# Patient Record
Sex: Male | Born: 1990 | Race: White | Hispanic: No | Marital: Single | Smoking: Current every day smoker
Health system: Southern US, Community
[De-identification: ages and names within clinical notes are randomized; demographics above are authoritative.]

## PROBLEM LIST (undated history)

## (undated) DIAGNOSIS — J45909 Unspecified asthma, uncomplicated: Secondary | ICD-10-CM

## (undated) DIAGNOSIS — F909 Attention-deficit hyperactivity disorder, unspecified type: Secondary | ICD-10-CM

## (undated) DIAGNOSIS — L0390 Cellulitis, unspecified: Secondary | ICD-10-CM

---
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
## 2013-05-04 IMAGING — CR DG HAND COMPLETE 3+V*L*
3 series · 3 of 3 positions shown · non-contrast
Comparison: None.

CLINICAL DATA: Pain, trauma.

LEFT HAND - COMPLETE 3+ VIEW

[view not recorded (1 of 3)]
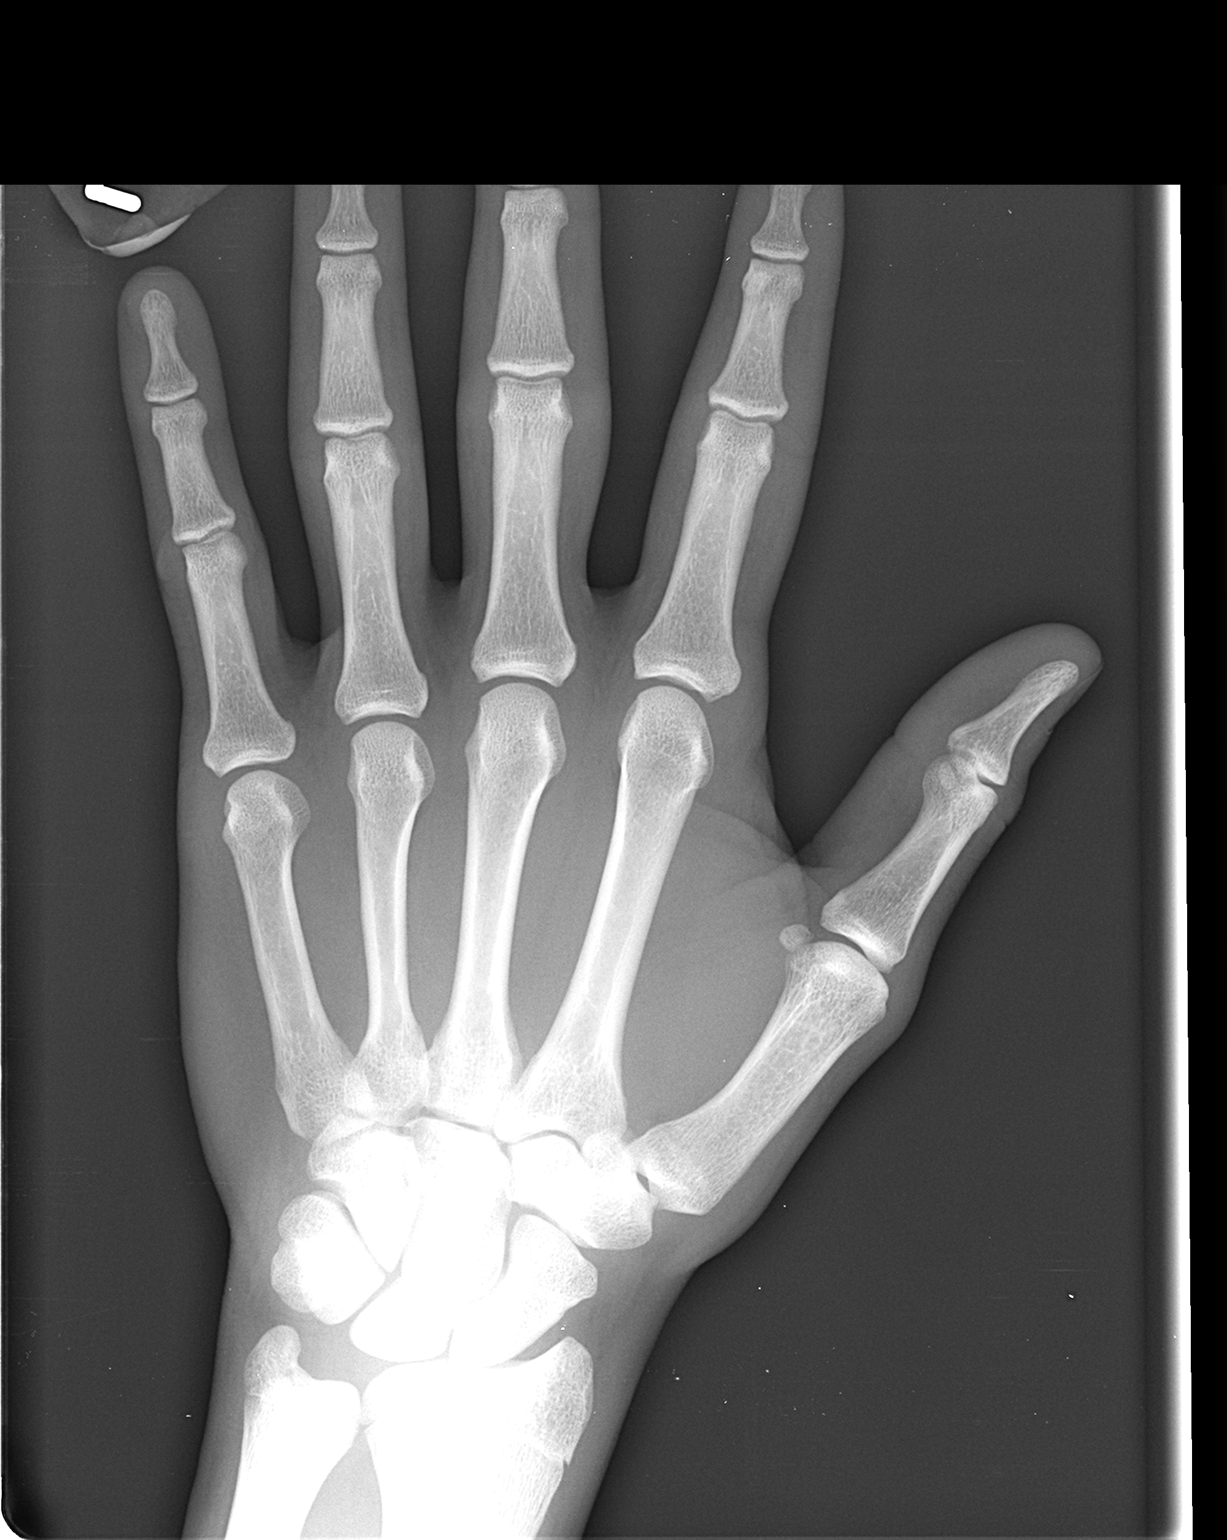

[view not recorded (2 of 3)]
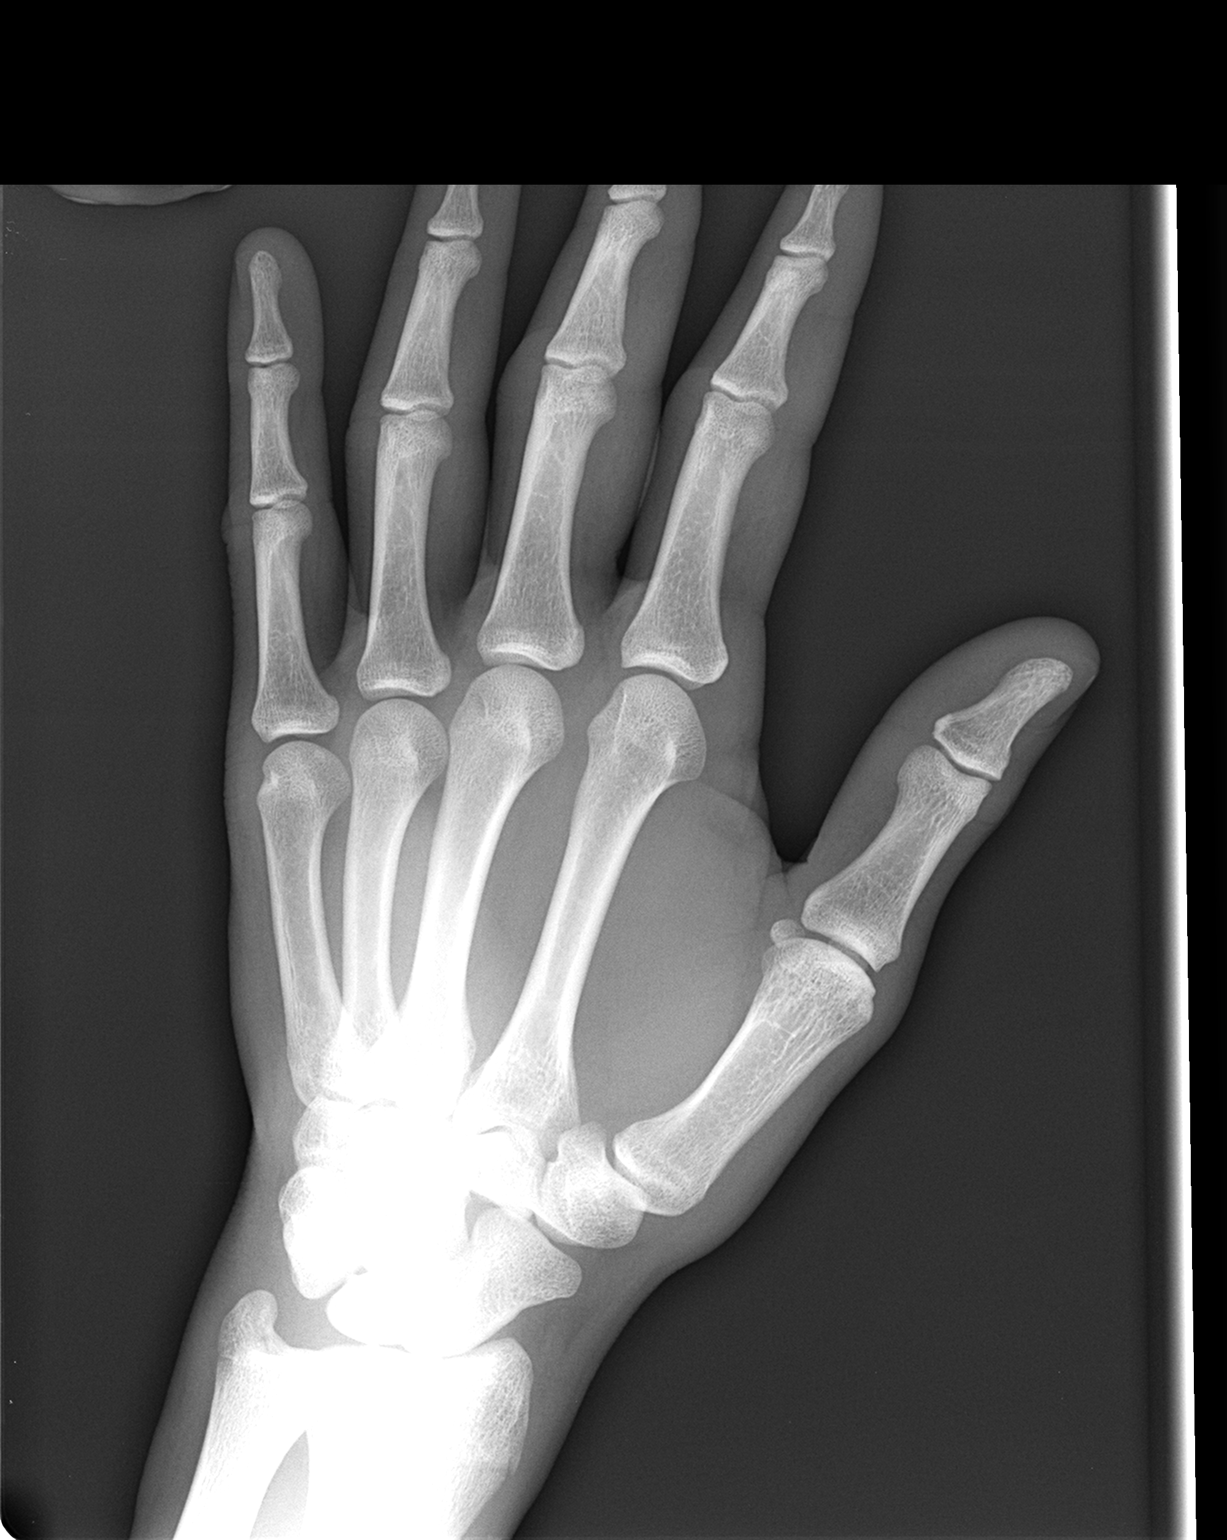

[view not recorded (3 of 3)]
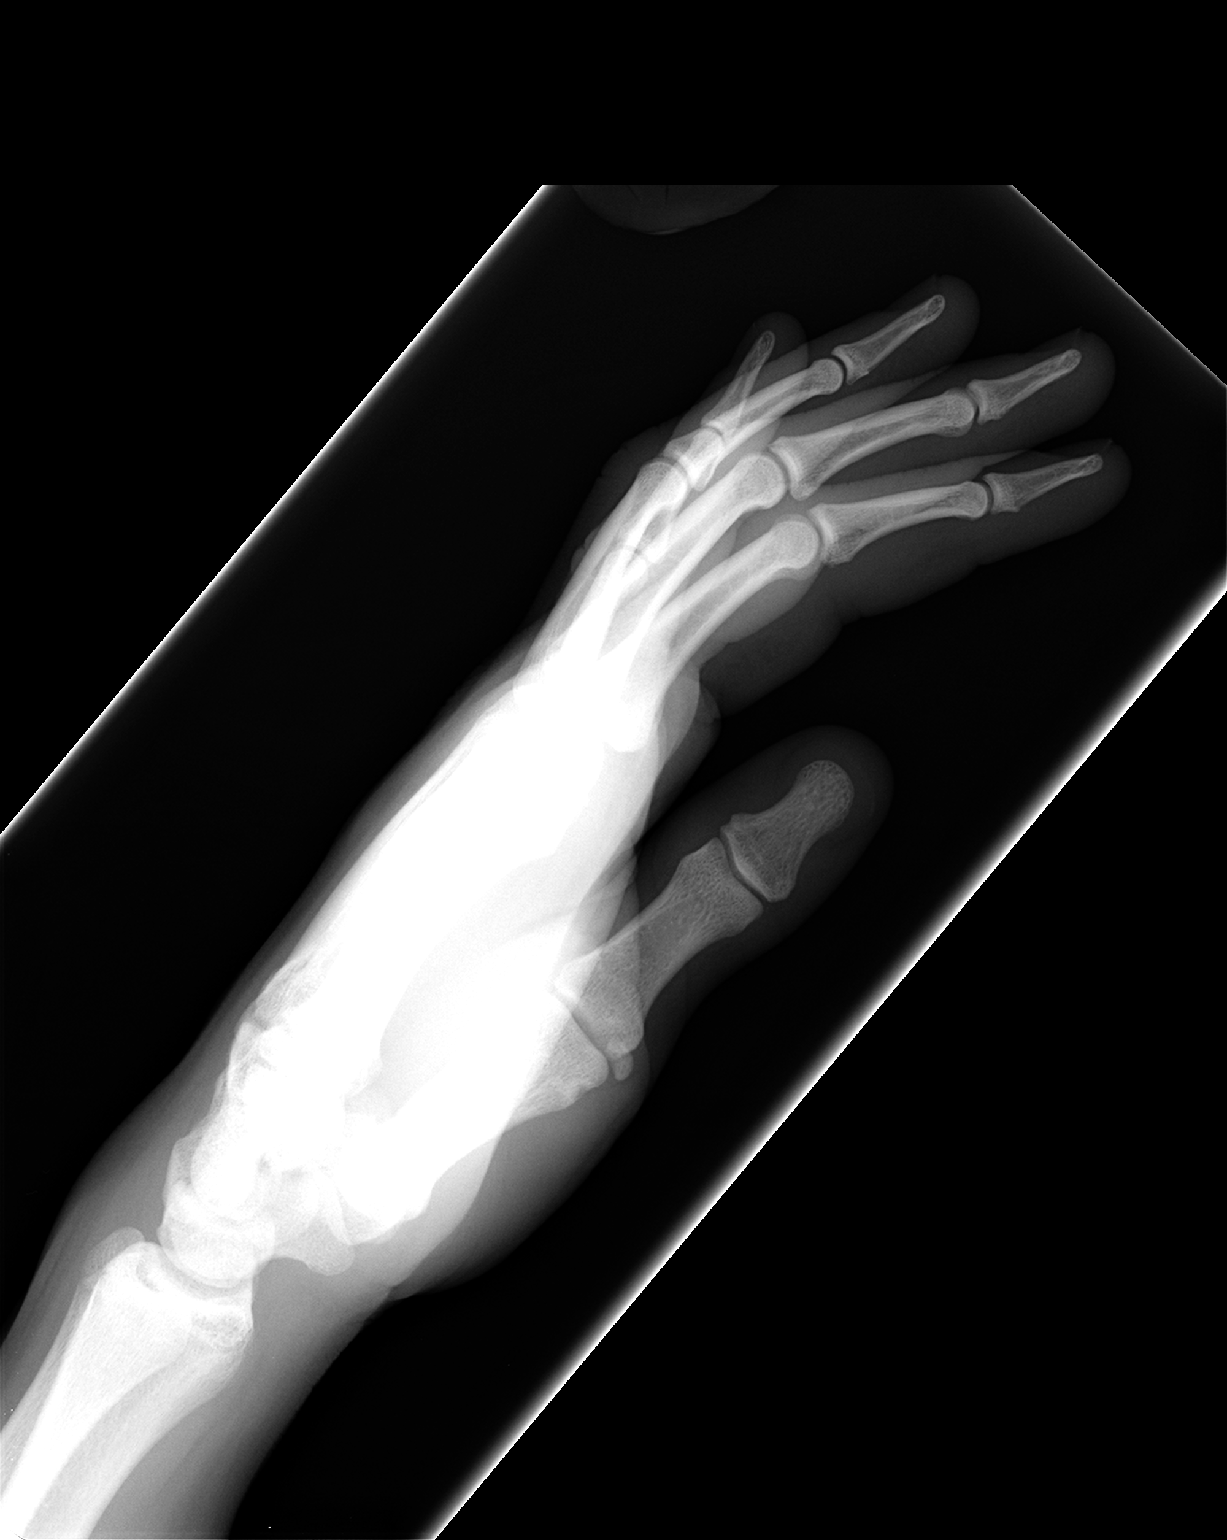

[3 of 3 positions shown; findings below may reference images not displayed]

FINDINGS: No fracture or dislocation.  No aggressive osseous
lesion.  No radiopaque foreign body.
IMPRESSION: No acute osseous abnormality of the left hand. If clinical concern
for a fracture persists, recommend a repeat radiograph in 5-10 days
to evaluate for interval change or callus formation.

## 2018-04-18 ENCOUNTER — Other Ambulatory Visit: Payer: Self-pay

## 2018-04-18 ENCOUNTER — Encounter (HOSPITAL_COMMUNITY): Payer: Self-pay | Admitting: Emergency Medicine

## 2018-04-18 ENCOUNTER — Emergency Department (HOSPITAL_COMMUNITY)
Admission: EM | Admit: 2018-04-18 | Discharge: 2018-04-18 | Disposition: A | Discharge status: Home / Self Care | Payer: Self-pay | Attending: Emergency Medicine | Admitting: Emergency Medicine

## 2018-04-18 DIAGNOSIS — J45909 Unspecified asthma, uncomplicated: Secondary | ICD-10-CM | POA: Insufficient documentation

## 2018-04-18 DIAGNOSIS — F909 Attention-deficit hyperactivity disorder, unspecified type: Secondary | ICD-10-CM | POA: Insufficient documentation

## 2018-04-18 DIAGNOSIS — L02415 Cutaneous abscess of right lower limb: Secondary | ICD-10-CM | POA: Insufficient documentation

## 2018-04-18 DIAGNOSIS — F17210 Nicotine dependence, cigarettes, uncomplicated: Secondary | ICD-10-CM | POA: Insufficient documentation

## 2018-04-18 DIAGNOSIS — L03115 Cellulitis of right lower limb: Secondary | ICD-10-CM | POA: Insufficient documentation

## 2018-04-18 MED ORDER — HYDROCODONE-ACETAMINOPHEN 5-325 MG PO TABS
1.0000 | ORAL_TABLET | Freq: Once | ORAL | Status: AC
  Administered 2018-04-18: 1 via ORAL
  Filled 2018-04-18: qty 1

## 2018-04-18 MED ORDER — LIDOCAINE-EPINEPHRINE (PF) 2 %-1:200000 IJ SOLN
10.0000 mL | Freq: Once | INTRAMUSCULAR | Status: DC
  Filled 2018-04-18: qty 10

## 2018-04-18 MED ORDER — SULFAMETHOXAZOLE-TRIMETHOPRIM 800-160 MG PO TABS
1.0000 | ORAL_TABLET | Freq: Once | ORAL | Status: AC
  Administered 2018-04-18: 16:00:00 1 via ORAL
  Filled 2018-04-18: qty 1

## 2018-04-18 MED ORDER — IBUPROFEN 800 MG PO TABS
800.0000 mg | ORAL_TABLET | Freq: Once | ORAL | Status: AC
  Administered 2018-04-18: 800 mg via ORAL
  Filled 2018-04-18: qty 1

## 2018-04-18 MED ORDER — IBUPROFEN 600 MG PO TABS: 600.0000 mg | ORAL_TABLET | Freq: Four times a day (QID) | ORAL | 0 refills | Status: DC | PRN

## 2018-04-18 MED ORDER — HYDROCODONE-ACETAMINOPHEN 5-325 MG PO TABS: 1.0000 | ORAL_TABLET | ORAL | 0 refills | Status: DC | PRN

## 2018-04-18 MED ORDER — SULFAMETHOXAZOLE-TRIMETHOPRIM 800-160 MG PO TABS: 1.0000 | ORAL_TABLET | Freq: Two times a day (BID) | ORAL | 0 refills | Status: AC

## 2018-04-18 NOTE — ED Triage Notes (Signed)
Patient complains of right knee pain and purulent drainage. Redness and swelling x 3 days. Denies fever, n/v.

## 2018-04-18 NOTE — ED Notes (Signed)
Pt verbalized understanding of no driving and to use caution within 4 hours of taking pain meds due to meds cause drowsiness.  Instructed pt to take all of antibiotics as prescribed. 

## 2018-04-18 NOTE — ED Provider Notes (Signed)
[facility] EMERGENCY DEPARTMENT Provider Note   CSN: [id number] Arrival date & time: 04/18/18  1519    History   Chief Complaint Chief Complaint  Patient presents with  . Knee Pain    HPI Blake Trujillo is a 28 y.o. male.     Pt presents to the ED today with redness and swelling to right knee.  Pt said it's been there for about 3 days and is getting worse.  He has noticed some drainage from a wound.     Past Medical History:  Diagnosis Date  . ADHD (attention deficit hyperactivity disorder)   . Asthma     There are no active problems to display for this patient.   History reviewed. No pertinent surgical history.      Home Medications    Prior to Admission medications   Medication Sig Start Date End Date Taking? Authorizing Provider  HYDROcodone-acetaminophen (NORCO/VICODIN) 5-325 MG tablet Take 1 tablet by mouth every 4 (four) hours as needed. 04/18/18   Jacalyn Lefevre, MD  ibuprofen (ADVIL,MOTRIN) 600 MG tablet Take 1 tablet (600 mg total) by mouth every 6 (six) hours as needed. 04/18/18   Jacalyn Lefevre, MD  sulfamethoxazole-trimethoprim (BACTRIM DS,SEPTRA DS) 800-160 MG tablet Take 1 tablet by mouth 2 (two) times daily for 7 days. 04/18/18 04/25/18  Jacalyn Lefevre, MD    Family History No family history on file.  Social History Social History   Tobacco Use  . Smoking status: Current Every Day Smoker  . Smokeless tobacco: Never Used  Substance Use Topics  . Alcohol use: Yes    Comment: occ  . Drug use: No     Allergies   Patient has no known allergies.   Review of Systems Review of Systems  Skin: Positive for wound.  All other systems reviewed and are negative.    Physical Exam Updated Vital Signs BP (!) 144/81 (BP Location: Left Arm)   Pulse 100   Temp 98.5 F (36.9 C) (Oral)   Resp 16   Ht 6\' 4"  (1.93 m)   Wt 83.9 kg   SpO2 100%   BMI 22.52 kg/m   Physical Exam Vitals signs and nursing note reviewed.  Constitutional:    Appearance: Normal appearance.  HENT:     Head: Normocephalic and atraumatic.     Right Ear: External ear normal.     Left Ear: External ear normal.     Nose: Nose normal.     Mouth/Throat:     Mouth: Mucous membranes are moist.  Eyes:     Extraocular Movements: Extraocular movements intact.     Pupils: Pupils are equal, round, and reactive to light.  Neck:     Musculoskeletal: Normal range of motion and neck supple.  Cardiovascular:     Rate and Rhythm: Normal rate and regular rhythm.     Pulses: Normal pulses.     Heart sounds: Normal heart sounds.  Pulmonary:     Effort: Pulmonary effort is normal.  Abdominal:     General: Abdomen is flat.  Musculoskeletal: Normal range of motion.  Skin:    Capillary Refill: Capillary refill takes less than 2 seconds.     Comments: Right knee with abscess and cellulitis.  Good rom in knee.  Doubt septic knee. See pictures.  Neurological:     General: No focal deficit present.     Mental Status: He is alert and oriented to person, place, and time.  Psychiatric:  Mood and Affect: Mood normal.        Behavior: Behavior normal.          ED Treatments / Results  Labs (all labs ordered are listed, but only abnormal results are displayed) Labs Reviewed - No data to display  EKG None  Radiology No results found.  Procedures .Marland KitchenIncision and Drainage Date/Time: 04/18/2018 3:54 PM Performed by: Jacalyn Lefevre, MD Authorized by: Jacalyn Lefevre, MD   Consent:    Consent obtained:  Verbal   Consent given by:  Patient   Risks discussed:  Bleeding, incomplete drainage and pain   Alternatives discussed:  No treatment Location:    Type:  Abscess   Size:  1   Location:  Lower extremity   Lower extremity location:  Knee   Knee location:  R knee Pre-procedure details:    Skin preparation:  Betadine Anesthesia (see MAR for exact dosages):    Anesthesia method:  Local infiltration   Local anesthetic:  Lidocaine 2% WITH epi  Procedure type:    Complexity:  Simple Procedure details:    Incision types:  Cruciate   Incision depth:  Dermal   Scalpel blade:  11   Wound management:  Probed and deloculated   Drainage:  Bloody and purulent   Drainage amount:  Moderate   Wound treatment:  Wound left open   Packing materials:  None Post-procedure details:    Patient tolerance of procedure:  Tolerated well, no immediate complications   (including critical care time)  Medications Ordered in ED Medications  lidocaine-EPINEPHrine (XYLOCAINE W/EPI) 2 %-1:200000 (PF) injection 10 mL (has no administration in time range)  sulfamethoxazole-trimethoprim (BACTRIM DS,SEPTRA DS) 800-160 MG per tablet 1 tablet (1 tablet Oral Given 04/18/18 1537)  ibuprofen (ADVIL,MOTRIN) tablet 800 mg (800 mg Oral Given 04/18/18 1537)  HYDROcodone-acetaminophen (NORCO/VICODIN) 5-325 MG per tablet 1 tablet (1 tablet Oral Given 04/18/18 1537)     Initial Impression / Assessment and Plan / ED Course  I have reviewed the triage vital signs and the nursing notes.  Pertinent labs & imaging results that were available during my care of the patient were reviewed by me and considered in my medical decision making (see chart for details).    Pt d/c home with bactrim.  He knows to return if worse.  F/u with pcp.  Final Clinical Impressions(s) / ED Diagnoses   Final diagnoses:  Cutaneous abscess of right lower extremity    ED Discharge Orders         Ordered    sulfamethoxazole-trimethoprim (BACTRIM DS,SEPTRA DS) 800-160 MG tablet  2 times daily     04/18/18 1553    ibuprofen (ADVIL,MOTRIN) 600 MG tablet  Every 6 hours PRN     04/18/18 1553    HYDROcodone-acetaminophen (NORCO/VICODIN) 5-325 MG tablet  Every 4 hours PRN     04/18/18 1553           Jacalyn Lefevre, MD 04/18/18 1555

## 2018-04-19 ENCOUNTER — Emergency Department (HOSPITAL_COMMUNITY)
Admission: EM | Admit: 2018-04-19 | Discharge: 2018-04-19 | Disposition: A | Discharge status: Home / Self Care | Payer: Self-pay | Attending: Emergency Medicine | Admitting: Emergency Medicine

## 2018-04-19 ENCOUNTER — Other Ambulatory Visit: Payer: Self-pay

## 2018-04-19 ENCOUNTER — Encounter (HOSPITAL_COMMUNITY): Payer: Self-pay | Admitting: *Deleted

## 2018-04-19 DIAGNOSIS — J45909 Unspecified asthma, uncomplicated: Secondary | ICD-10-CM | POA: Insufficient documentation

## 2018-04-19 DIAGNOSIS — F17200 Nicotine dependence, unspecified, uncomplicated: Secondary | ICD-10-CM | POA: Insufficient documentation

## 2018-04-19 DIAGNOSIS — L03115 Cellulitis of right lower limb: Secondary | ICD-10-CM | POA: Insufficient documentation

## 2018-04-19 DIAGNOSIS — Z79899 Other long term (current) drug therapy: Secondary | ICD-10-CM | POA: Insufficient documentation

## 2018-04-19 MED ORDER — DOXYCYCLINE HYCLATE 100 MG PO TABS
100.0000 mg | ORAL_TABLET | Freq: Once | ORAL | Status: AC
  Administered 2018-04-19: 04:00:00 100 mg via ORAL
  Filled 2018-04-19: qty 1

## 2018-04-19 MED ORDER — DOXYCYCLINE HYCLATE 100 MG PO CAPS: 100.0000 mg | ORAL_CAPSULE | Freq: Two times a day (BID) | ORAL | 0 refills | Status: DC

## 2018-04-19 NOTE — ED Provider Notes (Signed)
[facility] EMERGENCY DEPARTMENT Provider Note   CSN: [id number] Arrival date & time: 04/19/18  0400    History   Chief Complaint Chief Complaint  Patient presents with  . Knee Pain    HPI Blake Trujillo is a 28 y.o. male.     Patient presents to the emergency department for evaluation recheck of cellulitis.  Patient was seen in the ER yesterday for an abscess of the right lower leg with associated cellulitis.  He reports that the pain has significantly improved.  He has normal range of motion of the knee now, no longer hurts when he moves but he has noticed tonight that the area is more swollen and more red.  He has not had any fever.  He reports that the drainage from the incision and drainage site has stopped.     Past Medical History:  Diagnosis Date  . ADHD (attention deficit hyperactivity disorder)   . Asthma     There are no active problems to display for this patient.   History reviewed. No pertinent surgical history.      Home Medications    Prior to Admission medications   Medication Sig Start Date End Date Taking? Authorizing Provider  doxycycline (VIBRAMYCIN) 100 MG capsule Take 1 capsule (100 mg total) by mouth 2 (two) times daily. 04/19/18   Gilda Crease, MD  HYDROcodone-acetaminophen (NORCO/VICODIN) 5-325 MG tablet Take 1 tablet by mouth every 4 (four) hours as needed. 04/18/18   Jacalyn Lefevre, MD  ibuprofen (ADVIL,MOTRIN) 600 MG tablet Take 1 tablet (600 mg total) by mouth every 6 (six) hours as needed. 04/18/18   Jacalyn Lefevre, MD  sulfamethoxazole-trimethoprim (BACTRIM DS,SEPTRA DS) 800-160 MG tablet Take 1 tablet by mouth 2 (two) times daily for 7 days. 04/18/18 04/25/18  Jacalyn Lefevre, MD    Family History No family history on file.  Social History Social History   Tobacco Use  . Smoking status: Current Every Day Smoker  . Smokeless tobacco: Never Used  Substance Use Topics  . Alcohol use: Yes    Comment: occ  . Drug use: No      Allergies   Patient has no known allergies.   Review of Systems Review of Systems  Constitutional: Negative for fever.  Skin: Positive for color change.  All other systems reviewed and are negative.    Physical Exam Updated Vital Signs BP 132/84   Pulse 88   Temp (!) 97.5 F (36.4 C) (Oral)   Resp 16   Ht 6\' 4"  (1.93 m)   Wt 83.9 kg   SpO2 97%   BMI 22.51 kg/m   Physical Exam Vitals signs and nursing note reviewed.  Constitutional:      General: He is not in acute distress.    Appearance: Normal appearance. He is well-developed.  HENT:     Head: Normocephalic and atraumatic.     Right Ear: Hearing normal.     Left Ear: Hearing normal.     Nose: Nose normal.  Eyes:     Conjunctiva/sclera: Conjunctivae normal.     Pupils: Pupils are equal, round, and reactive to light.  Neck:     Musculoskeletal: Normal range of motion and neck supple.  Cardiovascular:     Rate and Rhythm: Regular rhythm.     Heart sounds: S1 normal and S2 normal. No murmur. No friction rub. No gallop.   Pulmonary:     Effort: Pulmonary effort is normal. No respiratory distress.  Breath sounds: Normal breath sounds.  Chest:     Chest wall: No tenderness.  Abdominal:     General: Bowel sounds are normal.     Palpations: Abdomen is soft.     Tenderness: There is no abdominal tenderness. There is no guarding or rebound. Negative signs include Murphy's sign and McBurney's sign.     Hernia: No hernia is present.  Musculoskeletal: Normal range of motion.     Comments: Normal range of motion right knee, no appreciable joint effusion.  Skin:    General: Skin is warm and dry.     Findings: Erythema present. No rash.     Comments: Erythema and warmth of right leg, from above the knee to mid shin.  Incision and drainage site intact, no purulent drainage at this time.  Neurological:     Mental Status: He is alert and oriented to person, place, and time.     GCS: GCS eye subscore is 4. GCS verbal  subscore is 5. GCS motor subscore is 6.     Cranial Nerves: No cranial nerve deficit.     Sensory: No sensory deficit.     Coordination: Coordination normal.  Psychiatric:        Speech: Speech normal.        Behavior: Behavior normal.        Thought Content: Thought content normal.        ED Treatments / Results  Labs (all labs ordered are listed, but only abnormal results are displayed) Labs Reviewed - No data to display  EKG None  Radiology No results found.  Procedures Procedures (including critical care time)  Medications Ordered in ED Medications - No data to display   Initial Impression / Assessment and Plan / ED Course  I have reviewed the triage vital signs and the nursing notes.  Pertinent labs & imaging results that were available during my care of the patient were reviewed by me and considered in my medical decision making (see chart for details).        Patient has noticed increased redness and swelling of the leg after incision and drainage yesterday.  It sounds like he has been squeezing on the area trying to get more pus out.  This may have increased the amount of cellulitis.  Note from yesterday was reviewed and images were reviewed, there is progression of the erythema.  He does, however, report that the pain is significantly improved.  He has completely normal range of motion of the knee without pain.  There is no joint effusion.  This is not a septic joint.  I suspect that this has just progressed as would be expected because he has not been on antibiotics long enough.  I do not want to admit the patient because of the current COVID-19 pandemic.  I did, however, recommend IV vancomycin as a single dose and possible follow-up tomorrow for recheck.  Patient reports that he "does not like needles" does not want to be here long enough to do the IV.  I will therefore add doxycycline to the Bactrim, given return precautions.  Final Clinical Impressions(s) / ED  Diagnoses   Final diagnoses:  Cellulitis of right lower extremity    ED Discharge Orders         Ordered    doxycycline (VIBRAMYCIN) 100 MG capsule  2 times daily     04/19/18 0421           Gilda CreasePollina, Larrie Lucia J, MD 04/19/18 [phone]

## 2018-04-19 NOTE — ED Triage Notes (Signed)
Pt c/o right knee swelling that has gotten worse, was seen in er for same on 04/18/2018.

## 2018-06-16 ENCOUNTER — Other Ambulatory Visit: Payer: Self-pay

## 2018-06-16 ENCOUNTER — Emergency Department (HOSPITAL_COMMUNITY)
Admission: EM | Admit: 2018-06-16 | Discharge: 2018-06-16 | Disposition: A | Discharge status: Home / Self Care | Payer: Self-pay | Attending: Emergency Medicine | Admitting: Emergency Medicine

## 2018-06-16 ENCOUNTER — Encounter (HOSPITAL_COMMUNITY): Payer: Self-pay | Admitting: Emergency Medicine

## 2018-06-16 DIAGNOSIS — R2241 Localized swelling, mass and lump, right lower limb: Secondary | ICD-10-CM | POA: Insufficient documentation

## 2018-06-16 DIAGNOSIS — L03115 Cellulitis of right lower limb: Secondary | ICD-10-CM | POA: Insufficient documentation

## 2018-06-16 DIAGNOSIS — J45909 Unspecified asthma, uncomplicated: Secondary | ICD-10-CM | POA: Insufficient documentation

## 2018-06-16 DIAGNOSIS — L539 Erythematous condition, unspecified: Secondary | ICD-10-CM | POA: Insufficient documentation

## 2018-06-16 DIAGNOSIS — F17200 Nicotine dependence, unspecified, uncomplicated: Secondary | ICD-10-CM | POA: Insufficient documentation

## 2018-06-16 HISTORY — DX: Cellulitis, unspecified: L03.90

## 2018-06-16 MED ORDER — DOXYCYCLINE HYCLATE 100 MG PO CAPS: 100.0000 mg | ORAL_CAPSULE | Freq: Two times a day (BID) | ORAL | 0 refills | Status: AC

## 2018-06-16 MED ORDER — DOXYCYCLINE HYCLATE 100 MG PO TABS
100.0000 mg | ORAL_TABLET | Freq: Once | ORAL | Status: AC
  Administered 2018-06-16: 23:00:00 100 mg via ORAL
  Filled 2018-06-16: qty 1
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
## 2018-06-16 NOTE — Discharge Instructions (Signed)
Take doxycycline as prescribed.  Take entire course, even if your symptoms improve. Use Tylenol and ibuprofen as needed for pain. Use ice to help with pain and swelling. Keep your foot elevated when able to help with pain and swelling. Monitor for worsening signs of infection.  Return to the emergency room if you develop high fevers, streaking up your leg, worsening symptoms despite antibiotics, or any new, or concerning symptoms.

## 2018-06-16 NOTE — ED Triage Notes (Signed)
Pt's right foot is red, swollen and warm to touch x 2 days. Pt states he has history of cellulitis.

## 2018-06-17 ENCOUNTER — Encounter (HOSPITAL_COMMUNITY): Payer: Self-pay | Admitting: Emergency Medicine

## 2018-06-17 ENCOUNTER — Other Ambulatory Visit: Payer: Self-pay

## 2018-06-17 ENCOUNTER — Emergency Department (HOSPITAL_COMMUNITY)
Admission: EM | Admit: 2018-06-17 | Discharge: 2018-06-18 | Disposition: A | Discharge status: Home / Self Care | Payer: Self-pay | Attending: Emergency Medicine | Admitting: Emergency Medicine

## 2018-06-17 DIAGNOSIS — J45909 Unspecified asthma, uncomplicated: Secondary | ICD-10-CM | POA: Insufficient documentation

## 2018-06-17 DIAGNOSIS — L03119 Cellulitis of unspecified part of limb: Secondary | ICD-10-CM

## 2018-06-17 DIAGNOSIS — F17200 Nicotine dependence, unspecified, uncomplicated: Secondary | ICD-10-CM | POA: Insufficient documentation

## 2018-06-17 DIAGNOSIS — L03115 Cellulitis of right lower limb: Secondary | ICD-10-CM | POA: Insufficient documentation

## 2018-06-17 LAB — CBC
HCT: 40.8 % (ref 39.0–52.0)
Hemoglobin: 13.4 g/dL (ref 13.0–17.0)
MCH: 31 pg (ref 26.0–34.0)
MCHC: 32.8 g/dL (ref 30.0–36.0)
MCV: 94.4 fL (ref 80.0–100.0)
Platelets: 300 10*3/uL (ref 150–400)
RBC: 4.32 MIL/uL (ref 4.22–5.81)
RDW: 13.4 % (ref 11.5–15.5)
WBC: 10.2 10*3/uL (ref 4.0–10.5)
nRBC: 0 % (ref 0.0–0.2)

## 2018-06-17 MED ORDER — KETOROLAC TROMETHAMINE 30 MG/ML IJ SOLN
15.0000 mg | Freq: Once | INTRAMUSCULAR | Status: AC
  Administered 2018-06-17: 15 mg via INTRAVENOUS
  Filled 2018-06-17: qty 1

## 2018-06-17 MED ORDER — HYDROCODONE-ACETAMINOPHEN 5-325 MG PO TABS
1.0000 | ORAL_TABLET | Freq: Once | ORAL | Status: AC
  Administered 2018-06-17: 1 via ORAL
  Filled 2018-06-17: qty 1

## 2018-06-17 MED ORDER — VANCOMYCIN HCL IN DEXTROSE 1-5 GM/200ML-% IV SOLN
1000.0000 mg | Freq: Once | INTRAVENOUS | Status: AC
Start: 2018-06-17 — End: 2018-06-18
  Administered 2018-06-17: 1000 mg via INTRAVENOUS
  Filled 2018-06-17: qty 200

## 2018-06-17 NOTE — ED Notes (Signed)
Pt c/o redness and swelling to right lower extremity, was seen in er for the same yesterday and reports that he has gotten worse.

## 2018-06-17 NOTE — ED Triage Notes (Signed)
Patient was seen in the ED last night for red swollen right foot. Patient has the same complaints tonight. Patient says swelling and redness started 2 days ago. Foot is red and patient has had increased swelling since yesterday. Patient was prescribed antibiotics yesterday.

## 2018-06-18 MED ORDER — HYDROCODONE-ACETAMINOPHEN 5-325 MG PO TABS: 1.0000 | ORAL_TABLET | ORAL | 0 refills | Status: DC | PRN

## 2018-06-18 NOTE — Discharge Instructions (Addendum)
As discussed, according to your past chart, the doxycycline IS the antibiotic that helped you the last time you had cellulitis.  I recommend getting this prescription filled and start in the morning.  Elevation and warm compresses can also help with pain and swelling.  You may take the hydrocodone prescribed for pain relief.  This will make you drowsy - do not drive within 4 hours of taking this medication.
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
## 2020-03-29 ENCOUNTER — Other Ambulatory Visit: Payer: Self-pay

## 2020-03-29 ENCOUNTER — Encounter (HOSPITAL_COMMUNITY): Payer: Self-pay | Admitting: *Deleted

## 2020-03-29 ENCOUNTER — Emergency Department (HOSPITAL_COMMUNITY)
Admission: EM | Admit: 2020-03-29 | Discharge: 2020-03-29 | Disposition: A | Discharge status: Home / Self Care | Payer: Self-pay | Attending: Emergency Medicine | Admitting: Emergency Medicine

## 2020-03-29 DIAGNOSIS — F17200 Nicotine dependence, unspecified, uncomplicated: Secondary | ICD-10-CM | POA: Insufficient documentation

## 2020-03-29 DIAGNOSIS — W890XXA Exposure to welding light (arc), initial encounter: Secondary | ICD-10-CM | POA: Insufficient documentation

## 2020-03-29 DIAGNOSIS — K122 Cellulitis and abscess of mouth: Secondary | ICD-10-CM | POA: Insufficient documentation

## 2020-03-29 DIAGNOSIS — S0031XA Abrasion of nose, initial encounter: Secondary | ICD-10-CM | POA: Insufficient documentation

## 2020-03-29 DIAGNOSIS — J45909 Unspecified asthma, uncomplicated: Secondary | ICD-10-CM | POA: Insufficient documentation

## 2020-03-29 MED ORDER — CEPHALEXIN 500 MG PO CAPS
500.0000 mg | ORAL_CAPSULE | Freq: Once | ORAL | Status: AC
  Administered 2020-03-29: 500 mg via ORAL
  Filled 2020-03-29: qty 1

## 2020-03-29 MED ORDER — MUPIROCIN CALCIUM 2 % EX CREA: 1.0000 "application " | TOPICAL_CREAM | Freq: Two times a day (BID) | CUTANEOUS | 0 refills | Status: DC

## 2020-03-29 MED ORDER — KETOROLAC TROMETHAMINE 60 MG/2ML IM SOLN
60.0000 mg | Freq: Once | INTRAMUSCULAR | Status: AC
  Administered 2020-03-29: 60 mg via INTRAMUSCULAR

## 2020-03-29 MED ORDER — CEPHALEXIN 500 MG PO CAPS
500.0000 mg | ORAL_CAPSULE | Freq: Three times a day (TID) | ORAL | 0 refills | Status: AC
Start: 2020-03-29 — End: 2020-04-03

## 2020-03-29 NOTE — ED Triage Notes (Signed)
Pain and swelling upper lip, unable to eat

## 2020-03-29 NOTE — ED Provider Notes (Signed)
[facility] EMERGENCY DEPARTMENT Provider Note   CSN: [id number] Arrival date & time: 03/29/20  1403     History Chief Complaint  Patient presents with  . Oral Swelling    Blake Trujillo is a 30 y.o. male with no relevant past medical history presents the ED with complaints of pain and swelling involving upper lip.  On my examination, patient reports that approximately 2 days ago he noticed a pimple on his upper lip and picked at it.  He states that since then he has noticed progressive pain and inflammation.  He has not taken anything for symptoms.  There is also an abrasion noted involving his left nare, immediately above his area of swelling/abrasion on upper lip.  He is adamant that they are not connected.  He states that the abrasion to his nare is a burn from his welding tool.  When asked why he was not wearing a face shield, he states that he attempted to lift up his shield with the welding tool which caused the burn injury.  He is adamantly denying any IVDA or other illicit drug use aside from intermittent marijuana.  He states that he is recently incarcerated and knows that he is not HIV positive.  He is unsure as to why he has been seen in the ER on multiple occasions with similar complaints of cellulitis/infection.    Patient states that he has some discomfort when eating.  He denies any fevers or chills, difficulty opening his mouth, drooling, voice change, sore throat, neck swelling, wheezing or stridor, shortness of breath, chest pain, abdominal discomfort, nausea, new medications or exposures, history of anaphylaxis, or any other symptoms.  HPI     Past Medical History:  Diagnosis Date  . ADHD (attention deficit hyperactivity disorder)   . Asthma   . Cellulitis     There are no problems to display for this patient.   History reviewed. No pertinent surgical history.     No family history on file.  Social History   Tobacco Use  . Smoking status: Current Every  Day Smoker  . Smokeless tobacco: Never Used  Substance Use Topics  . Alcohol use: Yes    Comment: occ  . Drug use: No    Home Medications Prior to Admission medications   Medication Sig Start Date End Date Taking? Authorizing Provider  cephALEXin (KEFLEX) 500 MG capsule Take 1 capsule (500 mg total) by mouth 3 (three) times daily for 5 days. 03/29/20 04/03/20 Yes Lorelee New, PA-C  mupirocin cream (BACTROBAN) 2 % Apply 1 application topically 2 (two) times daily. 03/29/20  Yes Lorelee New, PA-C  HYDROcodone-acetaminophen (NORCO/VICODIN) 5-325 MG tablet Take 1 tablet by mouth every 4 (four) hours as needed. 06/18/18   Burgess Amor, PA-C  ibuprofen (ADVIL,MOTRIN) 600 MG tablet Take 1 tablet (600 mg total) by mouth every 6 (six) hours as needed. Patient not taking: Reported on 06/17/2018 04/18/18   Jacalyn Lefevre, MD    Allergies    Patient has no known allergies.  Review of Systems   Review of Systems  All other systems reviewed and are negative.   Physical Exam Updated Vital Signs BP 99/70   Pulse 79   Temp 98.5 F (36.9 C) (Oral)   Resp 18   Ht 6\' 3"  (1.905 m)   Wt 84.4 kg   SpO2 98%   BMI 23.25 kg/m   Physical Exam Vitals and nursing note reviewed. Exam conducted with a chaperone present.  Constitutional:  General: He is not in acute distress.    Appearance: He is not toxic-appearing.  HENT:     Head: Normocephalic and atraumatic.     Mouth/Throat:     Comments: Mild nonbleeding abrasion to left nare.  No surrounding erythema or swelling.  Immediately inferior, there is an area of crusting and erythema with associated inflammation involving left side of his upper lip. Good dentition throughout.  No dental tenderness.  No areas of asymmetries or masses noted within the mouth.  No trismus.  Patent oropharynx.  No floor of mouth swelling.  Uvula is midline.  Tolerating secretions well.  No voice change. Eyes:     General: No scleral icterus.     Conjunctiva/sclera: Conjunctivae normal.  Neck:     Comments: No asymmetries noted. Cardiovascular:     Rate and Rhythm: Normal rate.     Pulses: Normal pulses.  Pulmonary:     Effort: Pulmonary effort is normal. No respiratory distress.     Breath sounds: No stridor. No wheezing.  Abdominal:     General: Abdomen is flat.     Palpations: Abdomen is soft.     Tenderness: There is no abdominal tenderness.  Musculoskeletal:     Cervical back: Normal range of motion and neck supple. No rigidity.  Skin:    General: Skin is dry.  Neurological:     Mental Status: He is alert.     GCS: GCS eye subscore is 4. GCS verbal subscore is 5. GCS motor subscore is 6.  Psychiatric:        Mood and Affect: Mood normal.        Behavior: Behavior normal.        Thought Content: Thought content normal.          ED Results / Procedures / Treatments   Labs (all labs ordered are listed, but only abnormal results are displayed) Labs Reviewed - No data to display  EKG None  Radiology No results found.  Procedures Procedures   Medications Ordered in ED Medications  ketorolac (TORADOL) injection 60 mg (60 mg Intramuscular Given 03/29/20 1523)  cephALEXin (KEFLEX) capsule 500 mg (500 mg Oral Given 03/29/20 1523)    ED Course  I have reviewed the triage vital signs and the nursing notes.  Pertinent labs & imaging results that were available during my care of the patient were reviewed by me and considered in my medical decision making (see chart for details).    MDM Rules/Calculators/A&P                          Blake Trujillo was evaluated in Emergency Department on 03/29/2020 for the symptoms described in the history of present illness. He was evaluated in the context of the global COVID-19 pandemic, which necessitated consideration that the patient might be at risk for infection with the SARS-CoV-2 virus that causes COVID-19. Institutional protocols and algorithms that pertain to the  evaluation of patients at risk for COVID-19 are in a state of rapid change based on information released by regulatory bodies including the CDC and federal and state organizations. These policies and algorithms were followed during the patient's care in the ED.  I personally reviewed patient's medical chart and all notes from triage and staff during today's encounter. I have also ordered and reviewed all labs and imaging that I felt to be medically necessary in the evaluation of this patient's complaints and with consideration of their  physical exam. If needed, translation services were available and utilized.   Patient with swelling and inflammation involving left side of upper lip which she reports is secondary to picking at a pimple.  He also describes recent abrasion to his left nare from welding injury.  He states that his workplace is dirty which is why he believes it has led to infection.  He does not take anything for his symptoms.  I am recommending that he check his temperature regularly and to take Tylenol and ibuprofen as needed.  I specifically recommended NSAIDs given anti-inflammatory properties.  Given his multiple encounters for cellulitis, will prescribe Keflex as well as a topical mupirocin to cover for MRSA/impetigo.  He denies any symptoms of systemic illness.  His oropharyngeal exam is benign and there is no reason why he cannot eat and drink.  He denies any difficulty swallowing and only mentions discomfort to upper lip.  No recent exposures or new medications.  Not consistent with angioedema.  Low suspicion for imminent airway compromise or anaphylaxis.  States that symptoms have been worsening over the past couple of days.  Encouraging that he follow-up with his primary care provider for wound check in 48 to 72 hours.  ED return precautions discussed.  Patient voices understanding and is agreeable to the plan.   Final Clinical Impression(s) / ED Diagnoses Final diagnoses:   Cellulitis of mouth    Rx / DC Orders ED Discharge Orders         Ordered    mupirocin cream (BACTROBAN) 2 %  2 times daily        03/29/20 1526    cephALEXin (KEFLEX) 500 MG capsule  3 times daily        03/29/20 1526           Elvera Maria 03/29/20 1534    Eber Hong, MD 04/01/20 [phone]

## 2020-03-29 NOTE — Discharge Instructions (Signed)
Please take your Keflex, as directed.  Apply the mupirocin ointment twice daily and keep covered with bandage.  Do your best to keep the area clean and covered.  Please notify your primary care provider of today's encounter and follow-up for wound check in 2 to 3 days.  I would like for you to take ibuprofen 600 mg every 6 hours as needed for inflammation and pain control.  Please also check your temperature regularly.  Return to the ED or seek immediate medical attention should you experience any new or worsening symptoms.

## 2020-03-30 ENCOUNTER — Encounter (HOSPITAL_COMMUNITY): Payer: Self-pay | Admitting: Emergency Medicine

## 2020-03-30 ENCOUNTER — Emergency Department (HOSPITAL_COMMUNITY)
Admission: EM | Admit: 2020-03-30 | Discharge: 2020-03-30 | Disposition: A | Discharge status: Home / Self Care | Payer: Self-pay | Attending: Emergency Medicine | Admitting: Emergency Medicine

## 2020-03-30 ENCOUNTER — Other Ambulatory Visit: Payer: Self-pay

## 2020-03-30 DIAGNOSIS — F17200 Nicotine dependence, unspecified, uncomplicated: Secondary | ICD-10-CM | POA: Insufficient documentation

## 2020-03-30 DIAGNOSIS — L03211 Cellulitis of face: Secondary | ICD-10-CM | POA: Insufficient documentation

## 2020-03-30 DIAGNOSIS — J45909 Unspecified asthma, uncomplicated: Secondary | ICD-10-CM | POA: Insufficient documentation

## 2020-03-30 LAB — CBC WITH DIFFERENTIAL/PLATELET
Abs Immature Granulocytes: 0.03 10*3/uL (ref 0.00–0.07)
Basophils Absolute: 0 10*3/uL (ref 0.0–0.1)
Basophils Relative: 0 %
Eosinophils Absolute: 0.1 10*3/uL (ref 0.0–0.5)
Eosinophils Relative: 1 %
HCT: 42.3 % (ref 39.0–52.0)
Hemoglobin: 14.2 g/dL (ref 13.0–17.0)
Immature Granulocytes: 0 %
Lymphocytes Relative: 17 %
Lymphs Abs: 1.5 10*3/uL (ref 0.7–4.0)
MCH: 32 pg (ref 26.0–34.0)
MCHC: 33.6 g/dL (ref 30.0–36.0)
MCV: 95.3 fL (ref 80.0–100.0)
Monocytes Absolute: 1 10*3/uL (ref 0.1–1.0)
Monocytes Relative: 11 %
Neutro Abs: 6.1 10*3/uL (ref 1.7–7.7)
Neutrophils Relative %: 71 %
Platelets: 265 10*3/uL (ref 150–400)
RBC: 4.44 MIL/uL (ref 4.22–5.81)
RDW: 13.2 % (ref 11.5–15.5)
WBC: 8.7 10*3/uL (ref 4.0–10.5)
nRBC: 0 % (ref 0.0–0.2)

## 2020-03-30 LAB — BASIC METABOLIC PANEL
Anion gap: 9 (ref 5–15)
BUN: 10 mg/dL (ref 6–20)
CO2: 26 mmol/L (ref 22–32)
Calcium: 9.1 mg/dL (ref 8.9–10.3)
Chloride: 106 mmol/L (ref 98–111)
Creatinine, Ser: 0.89 mg/dL (ref 0.61–1.24)
GFR, Estimated: >60 mL/min (ref >60)
Glucose, Bld: 109 mg/dL — ABNORMAL HIGH (ref 70–99)
Potassium: 3.4 mmol/L — ABNORMAL LOW (ref 3.5–5.1)
Sodium: 141 mmol/L (ref 135–145)

## 2020-03-30 MED ORDER — CLINDAMYCIN HCL 150 MG PO CAPS: 300.0000 mg | ORAL_CAPSULE | Freq: Four times a day (QID) | ORAL | 0 refills | Status: DC

## 2020-03-30 MED ORDER — CLINDAMYCIN PHOSPHATE 600 MG/50ML IV SOLN
600.0000 mg | Freq: Once | INTRAVENOUS | Status: AC
Start: 2020-03-30 — End: 2020-03-30
  Administered 2020-03-30: 600 mg via INTRAVENOUS
  Filled 2020-03-30: qty 50

## 2020-03-30 NOTE — ED Provider Notes (Signed)
[name] Provider Note   CSN: [id number] Arrival date & time: 03/30/20  0344     History Chief Complaint  Patient presents with  . Facial Swelling    Blake Trujillo is a 30 y.o. male.  Patient presents with worse swelling of his upper lip.  Patient was seen earlier and treated for early cellulitis/abscess of the upper lip area.  He was prescribed doxycycline.  He reports that he went to work and halfway through his shift he noticed that his face was very swollen.        Past Medical History:  Diagnosis Date  . ADHD (attention deficit hyperactivity disorder)   . Asthma   . Cellulitis     There are no problems to display for this patient.   History reviewed. No pertinent surgical history.     No family history on file.  Social History   Tobacco Use  . Smoking status: Current Every Day Smoker  . Smokeless tobacco: Never Used  Substance Use Topics  . Alcohol use: Yes    Comment: occ  . Drug use: No    Home Medications Prior to Admission medications   Medication Sig Start Date End Date Taking? Authorizing Provider  cephALEXin (KEFLEX) 500 MG capsule Take 1 capsule (500 mg total) by mouth 3 (three) times daily for 5 days. 03/29/20 04/03/20  Lorelee New, PA-C  HYDROcodone-acetaminophen (NORCO/VICODIN) 5-325 MG tablet Take 1 tablet by mouth every 4 (four) hours as needed. 06/18/18   Burgess Amor, PA-C  ibuprofen (ADVIL,MOTRIN) 600 MG tablet Take 1 tablet (600 mg total) by mouth every 6 (six) hours as needed. Patient not taking: Reported on 06/17/2018 04/18/18   Jacalyn Lefevre, MD  mupirocin cream (BACTROBAN) 2 % Apply 1 application topically 2 (two) times daily. 03/29/20   Lorelee New, PA-C    Allergies    Patient has no known allergies.  Review of Systems   Review of Systems  HENT: Positive for facial swelling.   Skin: Positive for wound.  All other systems reviewed and are negative.   Physical Exam Updated Vital Signs BP (!)  144/88   Pulse 77   Resp 18   Wt 85 kg   SpO2 99%   BMI 23.42 kg/m   Physical Exam Vitals and nursing note reviewed.  Constitutional:      General: He is not in acute distress.    Appearance: Normal appearance. He is well-developed.  HENT:     Head: Normocephalic and atraumatic.     Comments: Small scab left upper lip with surrounding erythema and swelling, no induration, no fluctuance    Right Ear: Hearing normal.     Left Ear: Hearing normal.     Nose: Nose normal.  Eyes:     Conjunctiva/sclera: Conjunctivae normal.     Pupils: Pupils are equal, round, and reactive to light.  Cardiovascular:     Rate and Rhythm: Regular rhythm.     Heart sounds: S1 normal and S2 normal. No murmur heard. No friction rub. No gallop.   Pulmonary:     Effort: Pulmonary effort is normal. No respiratory distress.     Breath sounds: Normal breath sounds.  Chest:     Chest wall: No tenderness.  Abdominal:     General: Bowel sounds are normal.     Palpations: Abdomen is soft.     Tenderness: There is no abdominal tenderness. There is no guarding or rebound. Negative signs include Murphy's sign and  McBurney's sign.     Hernia: No hernia is present.  Musculoskeletal:        General: Normal range of motion.     Cervical back: Normal range of motion and neck supple.  Skin:    General: Skin is warm and dry.     Findings: No rash.  Neurological:     Mental Status: He is alert and oriented to person, place, and time.     GCS: GCS eye subscore is 4. GCS verbal subscore is 5. GCS motor subscore is 6.     Cranial Nerves: No cranial nerve deficit.     Sensory: No sensory deficit.     Coordination: Coordination normal.  Psychiatric:        Speech: Speech normal.        Behavior: Behavior normal.        Thought Content: Thought content normal.     ED Results / Procedures / Treatments   Labs (all labs ordered are listed, but only abnormal results are displayed) Labs Reviewed  CBC WITH  DIFFERENTIAL/PLATELET  BASIC METABOLIC PANEL    EKG None  Radiology No results found.  Procedures Procedures   Medications Ordered in ED Medications  clindamycin (CLEOCIN) IVPB 600 mg (has no administration in time range)    ED Course  I have reviewed the triage vital signs and the nursing notes.  Pertinent labs & imaging results that were available during my care of the patient were reviewed by me and considered in my medical decision making (see chart for details).    MDM Rules/Calculators/A&P                          Patient returns with worsening swelling secondary to soft tissue infection of the left side of his upper lip.  Does not appear septic.  Examination does not reveal any obvious drainable abscess.  Basic labs okay.  IV clindamycin and discharged on clindamycin.  Final Clinical Impression(s) / ED Diagnoses Final diagnoses:  Facial cellulitis    Rx / DC Orders ED Discharge Orders    None       Tajah Schreiner, Canary Brim, MD 03/30/20 [phone]

## 2020-03-30 NOTE — ED Triage Notes (Signed)
Pt c/o swelling to the upper lip and was seen in last 24 hours for the same.

## 2022-10-17 ENCOUNTER — Emergency Department (HOSPITAL_COMMUNITY)
Admission: EM | Admit: 2022-10-17 | Discharge: 2022-10-17 | Disposition: A | Discharge status: Home / Self Care | Payer: Self-pay | Attending: Emergency Medicine | Admitting: Emergency Medicine

## 2022-10-17 ENCOUNTER — Other Ambulatory Visit: Payer: Self-pay

## 2022-10-17 DIAGNOSIS — L03211 Cellulitis of face: Secondary | ICD-10-CM | POA: Insufficient documentation

## 2022-10-17 DIAGNOSIS — D72829 Elevated white blood cell count, unspecified: Secondary | ICD-10-CM | POA: Insufficient documentation

## 2022-10-17 DIAGNOSIS — R339 Retention of urine, unspecified: Secondary | ICD-10-CM | POA: Insufficient documentation

## 2022-10-17 LAB — URINALYSIS, ROUTINE W REFLEX MICROSCOPIC
Bacteria, UA: NONE SEEN
Bilirubin Urine: NEGATIVE
Glucose, UA: NEGATIVE mg/dL
Hgb urine dipstick: NEGATIVE
Ketones, ur: NEGATIVE mg/dL
Leukocytes,Ua: NEGATIVE
Nitrite: NEGATIVE
Protein, ur: 30 mg/dL — AB
Specific Gravity, Urine: 1.024 (ref 1.005–1.030)
pH: 6 (ref 5.0–8.0)

## 2022-10-17 LAB — COMPREHENSIVE METABOLIC PANEL
ALT: 20 U/L (ref 0–44)
AST: 20 U/L (ref 15–41)
Albumin: 4.7 g/dL (ref 3.5–5.0)
Alkaline Phosphatase: 76 U/L (ref 38–126)
Anion gap: 8 (ref 5–15)
BUN: 17 mg/dL (ref 6–20)
CO2: 28 mmol/L (ref 22–32)
Calcium: 9.1 mg/dL (ref 8.9–10.3)
Chloride: 103 mmol/L (ref 98–111)
Creatinine, Ser: 1.06 mg/dL (ref 0.61–1.24)
GFR, Estimated: >60 mL/min (ref >60)
Glucose, Bld: 101 mg/dL — ABNORMAL HIGH (ref 70–99)
Potassium: 4 mmol/L (ref 3.5–5.1)
Sodium: 139 mmol/L (ref 135–145)
Total Bilirubin: 0.5 mg/dL (ref 0.3–1.2)
Total Protein: 7.6 g/dL (ref 6.5–8.1)

## 2022-10-17 LAB — CBC WITH DIFFERENTIAL/PLATELET
Abs Immature Granulocytes: 0.05 10*3/uL (ref 0.00–0.07)
Basophils Absolute: 0 10*3/uL (ref 0.0–0.1)
Basophils Relative: 0 %
Eosinophils Absolute: 0.2 10*3/uL (ref 0.0–0.5)
Eosinophils Relative: 1 %
HCT: 44.9 % (ref 39.0–52.0)
Hemoglobin: 15.2 g/dL (ref 13.0–17.0)
Immature Granulocytes: 0 %
Lymphocytes Relative: 10 %
Lymphs Abs: 1.3 10*3/uL (ref 0.7–4.0)
MCH: 32.3 pg (ref 26.0–34.0)
MCHC: 33.9 g/dL (ref 30.0–36.0)
MCV: 95.5 fL (ref 80.0–100.0)
Monocytes Absolute: 1.2 10*3/uL — ABNORMAL HIGH (ref 0.1–1.0)
Monocytes Relative: 9 %
Neutro Abs: 10.7 10*3/uL — ABNORMAL HIGH (ref 1.7–7.7)
Neutrophils Relative %: 80 %
Platelets: 333 10*3/uL (ref 150–400)
RBC: 4.7 MIL/uL (ref 4.22–5.81)
RDW: 13.5 % (ref 11.5–15.5)
WBC: 13.5 10*3/uL — ABNORMAL HIGH (ref 4.0–10.5)
nRBC: 0 % (ref 0.0–0.2)

## 2022-10-17 MED ORDER — HYDROMORPHONE HCL 1 MG/ML IJ SOLN
1.0000 mg | Freq: Once | INTRAMUSCULAR | Status: AC
  Administered 2022-10-17: 1 mg via INTRAVENOUS
  Filled 2022-10-17: qty 1

## 2022-10-17 MED ORDER — SODIUM CHLORIDE 0.9 % IV BOLUS
1000.0000 mL | Freq: Once | INTRAVENOUS | Status: AC
  Administered 2022-10-17: 1000 mL via INTRAVENOUS

## 2022-10-17 MED ORDER — IBUPROFEN 800 MG PO TABS: 800.0000 mg | ORAL_TABLET | Freq: Three times a day (TID) | ORAL | 0 refills | Status: DC

## 2022-10-17 MED ORDER — CLINDAMYCIN HCL 150 MG PO CAPS: 150.0000 mg | ORAL_CAPSULE | Freq: Four times a day (QID) | ORAL | 0 refills | Status: DC

## 2022-10-17 MED ORDER — CLINDAMYCIN PHOSPHATE 600 MG/50ML IV SOLN
600.0000 mg | Freq: Once | INTRAVENOUS | Status: AC
  Administered 2022-10-17: 600 mg via INTRAVENOUS
  Filled 2022-10-17: qty 50

## 2022-10-17 NOTE — ED Notes (Addendum)
Went over [employer]. Pt stated he will be back later tonight because he didn't want to wait to pick up the antibiotics and feels his face already getting worse.

## 2022-10-17 NOTE — Discharge Instructions (Signed)
You are seen in the emergency department for facial swelling and pain.  You had blood work and were given antibiotics.  Please continue antibiotics and pain medication.  Follow-up with your regular doctor.  Return to the emergency department if any worsening or concerning symptoms

## 2022-10-17 NOTE — ED Notes (Signed)
While being triaged, pt became agitated that Rod RN was asking him questions. Pt stated " if this Son of a b--- can't tell what's going on then he shouldn't be working here". RN Rod proceeded to ask the pt questions in order to properly triage all complaints, which agitated pt further.  Pt's behavior prompted his mother to leave and walkout of triage room. Minutes later pt got up a proceeded to leave the triage room, when he got out to the lobby pt's mother talked to him which made him stay to finished being triaged. Triage assessment was completed and pt sent to wait in lobby for the next room.

## 2022-10-17 NOTE — ED Provider Notes (Signed)
[place] EMERGENCY DEPARTMENT AT [facility] Provider Note   CSN: [id number] Arrival date & time: 10/17/22  1955     History {Add pertinent medical, surgical, social history, OB history to HPI:1} Chief Complaint  Patient presents with   Facial Swelling    Pt presents to ED with right sided facial swelling that started 2 days ago. Pt also endorses not being able to urinate for the past day and is having abdominal pain.  Denies Nausea/ Vomiting   Possible Infection   Urinary Retention    Blake Trujillo is a 32 y.o. male.  He is brought in by his mother for evaluation of facial swelling and pain.  He said it started 2 days ago getting progressively worse.  He is not having any dental pain.  He thinks it might be an ingrown hair.  He also says he has been able to urinate today.  No fevers chills nausea vomiting.  Has not tried anything for his symptoms.  The history is provided by the patient and a parent.  Facial Injury Injury mechanism: no injury. Location:  R cheek Time since incident:  3 days Pain details:    Quality:  Aching and throbbing   Severity:  Severe   Timing:  Constant   Progression:  Worsening Relieved by:  None tried Worsened by:  Nothing Ineffective treatments:  None tried Associated symptoms: no nausea, no neck pain, no trismus and no vomiting        Home Medications Prior to Admission medications   Medication Sig Start Date End Date Taking? Authorizing Provider  clindamycin (CLEOCIN) 150 MG capsule Take 2 capsules (300 mg total) by mouth 4 (four) times daily. 03/30/20   Gilda Crease, MD  HYDROcodone-acetaminophen (NORCO/VICODIN) 5-325 MG tablet Take 1 tablet by mouth every 4 (four) hours as needed. 06/18/18   Burgess Amor, PA-C  ibuprofen (ADVIL,MOTRIN) 600 MG tablet Take 1 tablet (600 mg total) by mouth every 6 (six) hours as needed. Patient not taking: Reported on 06/17/2018 04/18/18   Jacalyn Lefevre, MD  mupirocin cream (BACTROBAN) 2 %  Apply 1 application topically 2 (two) times daily. 03/29/20   Lorelee New, PA-C      Allergies    Patient has no known allergies.    Review of Systems   Review of Systems  Constitutional:  Negative for fever.  HENT:  Positive for facial swelling.   Respiratory:  Negative for shortness of breath.   Cardiovascular:  Negative for chest pain.  Gastrointestinal:  Negative for nausea and vomiting.  Genitourinary:  Positive for difficulty urinating.  Musculoskeletal:  Negative for neck pain.    Physical Exam Updated Vital Signs BP (!) 139/110 (BP Location: Right Arm)   Pulse 85   Temp 98.2 F (36.8 C) (Oral)   Resp (!) 22   Ht 6\' 3"  (1.905 m)   Wt 85 kg   SpO2 100%   BMI 23.42 kg/m  Physical Exam Vitals and nursing note reviewed.  Constitutional:      General: He is not in acute distress.    Appearance: Normal appearance. He is well-developed.  HENT:     Head: Normocephalic and atraumatic.     Right Ear: Tympanic membrane normal.     Left Ear: Tympanic membrane normal.     Nose: Nose normal.     Mouth/Throat:     Mouth: Mucous membranes are moist.     Pharynx: Oropharynx is clear.     Comments: Patient  has right cheek swelling.  There is no trismus.  He has poor dentition but no dental tenderness.  He has palpable induration approximately 1 cm within his cheek.  There is no neck swelling.  No overlying cellulitis or crepitus. Eyes:     Conjunctiva/sclera: Conjunctivae normal.  Cardiovascular:     Rate and Rhythm: Normal rate and regular rhythm.     Heart sounds: No murmur heard. Pulmonary:     Effort: Pulmonary effort is normal. No respiratory distress.     Breath sounds: Normal breath sounds.  Abdominal:     Palpations: Abdomen is soft.     Tenderness: There is no abdominal tenderness.  Musculoskeletal:        General: No deformity. Normal range of motion.     Cervical back: Neck supple.  Skin:    General: Skin is warm and dry.     Capillary Refill: Capillary  refill takes less than 2 seconds.  Neurological:     General: No focal deficit present.     Mental Status: He is alert.     Sensory: No sensory deficit.     Motor: No weakness.     ED Results / Procedures / Treatments   Labs (all labs ordered are listed, but only abnormal results are displayed) Labs Reviewed - No data to display  EKG None  Radiology No results found.  Procedures Procedures  {Document cardiac monitor, telemetry assessment procedure when appropriate:1}  Medications Ordered in ED Medications  sodium chloride 0.9 % bolus 1,000 mL (has no administration in time range)  clindamycin (CLEOCIN) IVPB 600 mg (has no administration in time range)  HYDROmorphone (DILAUDID) injection 1 mg (has no administration in time range)    ED Course/ Medical Decision Making/ A&P   {   Click here for ABCD2, HEART and other calculatorsREFRESH Note before signing :1}                              Medical Decision Making Amount and/or Complexity of Data Reviewed Labs: ordered.  Risk Prescription drug management.   This patient complains of ***; this involves an extensive number of treatment Options and is a complaint that carries with it a high risk of complications and morbidity. The differential includes ***  I ordered, reviewed and interpreted labs, which included *** I ordered medication *** and reviewed PMP when indicated. I ordered imaging studies which included *** and I independently    visualized and interpreted imaging which showed *** Additional history obtained from *** Previous records obtained and reviewed *** I consulted *** and discussed lab and imaging findings and discussed disposition.  Cardiac monitoring reviewed, *** Social determinants considered, *** Critical Interventions: ***  After the interventions stated above, I reevaluated the patient and found *** Admission and further testing considered, ***   {Document critical care time when  appropriate:1} {Document review of labs and clinical decision tools ie heart score, Chads2Vasc2 etc:1}  {Document your independent review of radiology images, and any outside records:1} {Document your discussion with family members, caretakers, and with consultants:1} {Document social determinants of health affecting pt's care:1} {Document your decision making why or why not admission, treatments were needed:1} Final Clinical Impression(s) / ED Diagnoses Final diagnoses:  None    Rx / DC Orders ED Discharge Orders     None

## 2022-10-17 NOTE — ED Triage Notes (Signed)
Pt presents to ED with right sided facial swelling that started 2 days ago. Pt also endorses not being able to urinate for the past day and is having abdominal pain.  Denies Nausea/ Vomiting

## 2022-10-21 ENCOUNTER — Ambulatory Visit: Payer: Self-pay | Admitting: Physician Assistant

## 2022-10-21 NOTE — Progress Notes (Deleted)
New patient visit   Patient: Blake Trujillo   DOB: 04-12-1990   32 y.o. Male  MRN: [id number] Visit Date: 10/21/2022  Today's healthcare provider: Alfredia Ferguson, PA-C   Cc. New patient, ED f/u  Subjective    Blake Trujillo is a 32 y.o. male who presents today as a new patient to establish care.  HPI  Pt was seen in the ED 10/17/22 for facial cellulitis, elevated white count. Given clindamycin and hydromorphone. D/c on clindamycin.   Pt reports   Past Medical History:  Diagnosis Date   ADHD (attention deficit hyperactivity disorder)    Asthma    Cellulitis    No past surgical history on file. No family status information on file.   No family history on file. Social History   Socioeconomic History   Marital status: Single    Spouse name: Not on file   Number of children: Not on file   Years of education: Not on file   Highest education level: Not on file  Occupational History   Not on file  Tobacco Use   Smoking status: Every Day   Smokeless tobacco: Never  Substance and Sexual Activity   Alcohol use: Yes    Comment: occ   Drug use: No   Sexual activity: Not on file  Other Topics Concern   Not on file  Social History Narrative   Not on file   Social Determinants of Health   Financial Resource Strain: Not on file  Food Insecurity: Not on file  Transportation Needs: Not on file  Physical Activity: Not on file  Stress: Not on file  Social Connections: Not on file   Outpatient Medications Prior to Visit  Medication Sig   clindamycin (CLEOCIN) 150 MG capsule Take 1 capsule (150 mg total) by mouth 4 (four) times daily.   HYDROcodone-acetaminophen (NORCO/VICODIN) 5-325 MG tablet Take 1 tablet by mouth every 4 (four) hours as needed.   ibuprofen (ADVIL) 800 MG tablet Take 1 tablet (800 mg total) by mouth 3 (three) times daily.   mupirocin cream (BACTROBAN) 2 % Apply 1 application topically 2 (two) times daily.   No facility-administered medications  prior to visit.   No Known Allergies   There is no immunization history on file for this patient.  Health Maintenance  Topic Date Due   HIV Screening  Never done   Hepatitis C Screening  Never done   DTaP/Tdap/Td (1 - Tdap) Never done   INFLUENZA VACCINE  Never done   COVID-19 Vaccine (1 - 2023-24 season) Never done   HPV VACCINES  Aged Out    Patient Care Team: Joette Catching, MD as PCP - General (Family Medicine)  Review of Systems  {Insert previous labs (optional):23779} {See past labs  Heme  Chem  Endocrine  Serology  Results Review (optional):1}   Objective    There were no vitals taken for this visit. {Insert last BP/Wt (optional):23777}{See vitals history (optional):1}   Physical Exam ***  Depression Screen     No data to display         No results found for any visits on 10/21/22.  Assessment & Plan     ***  No follow-ups on file.    I, Alfredia Ferguson, PA-C have reviewed all documentation for this visit. The documentation on  10/21/22   for the exam, diagnosis, procedures, and orders are all accurate and complete.    Alfredia Ferguson, PA-C  [facility]  at [facility] [phone] (phone) [phone] (fax)  [facility]

## 2022-11-28 ENCOUNTER — Observation Stay (HOSPITAL_COMMUNITY)
Admission: EM | Admit: 2022-11-28 | Discharge: 2022-11-30 | Disposition: A | Discharge status: Home / Self Care | Payer: Self-pay | Attending: Orthopedic Surgery | Admitting: Orthopedic Surgery

## 2022-11-28 ENCOUNTER — Other Ambulatory Visit: Payer: Self-pay

## 2022-11-28 ENCOUNTER — Encounter (HOSPITAL_COMMUNITY): Payer: Self-pay

## 2022-11-28 ENCOUNTER — Emergency Department (HOSPITAL_COMMUNITY): Payer: Self-pay

## 2022-11-28 DIAGNOSIS — J45909 Unspecified asthma, uncomplicated: Secondary | ICD-10-CM | POA: Insufficient documentation

## 2022-11-28 DIAGNOSIS — M65141 Other infective (teno)synovitis, right hand: Principal | ICD-10-CM | POA: Insufficient documentation

## 2022-11-28 DIAGNOSIS — Z79899 Other long term (current) drug therapy: Secondary | ICD-10-CM | POA: Insufficient documentation

## 2022-11-28 DIAGNOSIS — M65949 Unspecified synovitis and tenosynovitis, unspecified hand: Secondary | ICD-10-CM | POA: Diagnosis present

## 2022-11-28 DIAGNOSIS — M79644 Pain in right finger(s): Principal | ICD-10-CM

## 2022-11-28 DIAGNOSIS — F1090 Alcohol use, unspecified, uncomplicated: Secondary | ICD-10-CM | POA: Insufficient documentation

## 2022-11-28 DIAGNOSIS — F17200 Nicotine dependence, unspecified, uncomplicated: Secondary | ICD-10-CM | POA: Insufficient documentation

## 2022-11-28 LAB — CBC WITH DIFFERENTIAL/PLATELET
Abs Immature Granulocytes: 0.03 10*3/uL (ref 0.00–0.07)
Basophils Absolute: 0 10*3/uL (ref 0.0–0.1)
Basophils Relative: 0 %
Eosinophils Absolute: 0 10*3/uL (ref 0.0–0.5)
Eosinophils Relative: 0 %
HCT: 36.9 % — ABNORMAL LOW (ref 39.0–52.0)
Hemoglobin: 12.9 g/dL — ABNORMAL LOW (ref 13.0–17.0)
Immature Granulocytes: 0 %
Lymphocytes Relative: 9 %
Lymphs Abs: 1.1 10*3/uL (ref 0.7–4.0)
MCH: 31.9 pg (ref 26.0–34.0)
MCHC: 35 g/dL (ref 30.0–36.0)
MCV: 91.3 fL (ref 80.0–100.0)
Monocytes Absolute: 1.2 10*3/uL — ABNORMAL HIGH (ref 0.1–1.0)
Monocytes Relative: 10 %
Neutro Abs: 9.9 10*3/uL — ABNORMAL HIGH (ref 1.7–7.7)
Neutrophils Relative %: 81 %
Platelets: 291 10*3/uL (ref 150–400)
RBC: 4.04 MIL/uL — ABNORMAL LOW (ref 4.22–5.81)
RDW: 13.3 % (ref 11.5–15.5)
WBC: 12.4 10*3/uL — ABNORMAL HIGH (ref 4.0–10.5)
nRBC: 0 % (ref 0.0–0.2)

## 2022-11-28 LAB — COMPREHENSIVE METABOLIC PANEL
ALT: 26 U/L (ref 0–44)
AST: 27 U/L (ref 15–41)
Albumin: 4.3 g/dL (ref 3.5–5.0)
Alkaline Phosphatase: 64 U/L (ref 38–126)
Anion gap: 9 (ref 5–15)
BUN: 10 mg/dL (ref 6–20)
CO2: 24 mmol/L (ref 22–32)
Calcium: 9.4 mg/dL (ref 8.9–10.3)
Chloride: 100 mmol/L (ref 98–111)
Creatinine, Ser: 0.78 mg/dL (ref 0.61–1.24)
GFR, Estimated: >60 mL/min (ref >60)
Glucose, Bld: 123 mg/dL — ABNORMAL HIGH (ref 70–99)
Potassium: 3.9 mmol/L (ref 3.5–5.1)
Sodium: 133 mmol/L — ABNORMAL LOW (ref 135–145)
Total Bilirubin: 0.7 mg/dL (ref <1.2)
Total Protein: 7 g/dL (ref 6.5–8.1)

## 2022-11-28 MED ORDER — CHLORHEXIDINE GLUCONATE 4 % EX SOLN
60.0000 mL | Freq: Once | CUTANEOUS | Status: DC
  Filled 2022-11-28: qty 15

## 2022-11-28 MED ORDER — HYDROMORPHONE HCL 1 MG/ML IJ SOLN
1.0000 mg | INTRAMUSCULAR | Status: AC | PRN
  Administered 2022-11-29 (×2): 1 mg via INTRAVENOUS
  Administered 2022-11-29 (×2): .5 mg via INTRAVENOUS
  Filled 2022-11-28 (×3): qty 1

## 2022-11-28 MED ORDER — MORPHINE SULFATE (PF) 4 MG/ML IV SOLN
4.0000 mg | Freq: Once | INTRAVENOUS | Status: AC
  Administered 2022-11-28: 4 mg via INTRAVENOUS
  Filled 2022-11-28: qty 1

## 2022-11-28 MED ORDER — MUPIROCIN 2 % EX OINT
1.0000 | TOPICAL_OINTMENT | Freq: Two times a day (BID) | CUTANEOUS | Status: DC
  Administered 2022-11-29: 1 via NASAL
  Filled 2022-11-28: qty 22

## 2022-11-28 MED ORDER — SODIUM CHLORIDE 0.9 % IV SOLN
1.5000 g | Freq: Four times a day (QID) | INTRAVENOUS | Status: DC
  Administered 2022-11-29: 1.5 g via INTRAVENOUS
  Filled 2022-11-28 (×7): qty 4

## 2022-11-28 MED ORDER — ONDANSETRON HCL 4 MG/2ML IJ SOLN
4.0000 mg | Freq: Three times a day (TID) | INTRAMUSCULAR | Status: AC | PRN
  Administered 2022-11-29: 4 mg via INTRAVENOUS

## 2022-11-28 MED ORDER — HYDROMORPHONE HCL 1 MG/ML IJ SOLN
1.0000 mg | Freq: Four times a day (QID) | INTRAMUSCULAR | Status: DC | PRN
  Administered 2022-11-28: 1 mg via INTRAVENOUS
  Filled 2022-11-28: qty 1

## 2022-11-28 MED ORDER — SODIUM CHLORIDE 0.9 % IV SOLN
1.5000 g | Freq: Once | INTRAVENOUS | Status: AC
  Administered 2022-11-28: 1.5 g via INTRAVENOUS
  Filled 2022-11-28: qty 4

## 2022-11-28 NOTE — H&P (Signed)
Blake Trujillo is an 32 y.o. male.   Chief Complaint: *** HPI: ***  Past Medical History:  Diagnosis Date   ADHD (attention deficit hyperactivity disorder)    Asthma    Cellulitis     History reviewed. No pertinent surgical history.  History reviewed. No pertinent family history. Social History:  reports that he has been smoking. He has never used smokeless tobacco. He reports current alcohol use. He reports that he does not use drugs.  Allergies: No Known Allergies  Medications Prior to Admission  Medication Sig Dispense Refill   clindamycin (CLEOCIN) 150 MG capsule Take 1 capsule (150 mg total) by mouth 4 (four) times daily. 28 capsule 0   HYDROcodone-acetaminophen (NORCO/VICODIN) 5-325 MG tablet Take 1 tablet by mouth every 4 (four) hours as needed. 15 tablet 0   ibuprofen (ADVIL) 800 MG tablet Take 1 tablet (800 mg total) by mouth 3 (three) times daily. 21 tablet 0   mupirocin cream (BACTROBAN) 2 % Apply 1 application topically 2 (two) times daily. 15 g 0    Results for orders placed or performed during the hospital encounter of 11/28/22 (from the past 48 hour(s))  CBC with Differential     Status: Abnormal   Collection Time: 11/28/22  8:56 PM  Result Value Ref Range   WBC 12.4 (H) 4.0 - 10.5 K/uL   RBC 4.04 (L) 4.22 - 5.81 MIL/uL   Hemoglobin 12.9 (L) 13.0 - 17.0 g/dL   HCT 54.2 (L) 70.6 - 23.7 %   MCV 91.3 80.0 - 100.0 fL   MCH 31.9 26.0 - 34.0 pg   MCHC 35.0 30.0 - 36.0 g/dL   RDW 62.8 31.5 - 17.6 %   Platelets 291 150 - 400 K/uL   nRBC 0.0 0.0 - 0.2 %   Neutrophils Relative % 81 %   Neutro Abs 9.9 (H) 1.7 - 7.7 K/uL   Lymphocytes Relative 9 %   Lymphs Abs 1.1 0.7 - 4.0 K/uL   Monocytes Relative 10 %   Monocytes Absolute 1.2 (H) 0.1 - 1.0 K/uL   Eosinophils Relative 0 %   Eosinophils Absolute 0.0 0.0 - 0.5 K/uL   Basophils Relative 0 %   Basophils Absolute 0.0 0.0 - 0.1 K/uL   Immature Granulocytes 0 %   Abs Immature Granulocytes 0.03 0.00 - 0.07 K/uL     Comment: Performed at [facility], [address], [place], [place] [address]  Comprehensive metabolic panel     Status: Abnormal   Collection Time: 11/28/22  8:56 PM  Result Value Ref Range   Sodium 133 (L) 135 - 145 mmol/L   Potassium 3.9 3.5 - 5.1 mmol/L   Chloride 100 98 - 111 mmol/L   CO2 24 22 - 32 mmol/L   Glucose, Bld 123 (H) 70 - 99 mg/dL    Comment: Glucose reference range applies only to samples taken after fasting for at least 8 hours.   BUN 10 6 - 20 mg/dL   Creatinine, Ser 7.10 0.61 - 1.24 mg/dL   Calcium 9.4 8.9 - 62.6 mg/dL   Total Protein 7.0 6.5 - 8.1 g/dL   Albumin 4.3 3.5 - 5.0 g/dL   AST 27 15 - 41 U/L   ALT 26 0 - 44 U/L   Alkaline Phosphatase 64 38 - 126 U/L   Total Bilirubin 0.7 <1.2 mg/dL   GFR, Estimated >94 >85 mL/min    Comment: (NOTE) Calculated using the CKD-EPI Creatinine Equation (2021)    Anion gap  9 5 - 15    Comment: Performed at [facility], [address], [place], [place] [address]   DG Finger Index Right  Result Date: 11/28/2022 CLINICAL DATA:  Injury EXAM: RIGHT INDEX FINGER 2+V COMPARISON:  None Available. FINDINGS: No fracture or malalignment. Diffuse soft tissue swelling. No radiopaque foreign body. No soft tissue emphysema IMPRESSION: No acute osseous abnormality. Electronically Signed   By: Jasmine Pang M.D.   On: 11/28/2022 21:47    Review of Systems  Blood pressure (!) 158/97, pulse 78, temperature 99 F (37.2 C), resp. rate 20, height 6\' 3"  (1.905 m), weight 84.8 kg, SpO2 100%. Physical Exam   Assessment/Plan ***  Fuller Canada, MD 11/28/2022, 10:51 PM

## 2022-11-28 NOTE — ED Triage Notes (Signed)
Pt states his first finger on R hand has been bothering him for 4 days; believes he got a piece of metal stuck in it at work. States he came to the ER tonight because of the pain and swelling. States he took 6 ibuprofen at 1600 this evening without relief.

## 2022-11-28 NOTE — ED Provider Notes (Signed)
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  He states he cut his finger on metal roughly 3 days ago and feels that there is still Trujillo foreign body in his finger.  He he tried to remove it with tweezers.  This morning he states he woke up and his finger was severely swollen and painful.  He states he can hardly move his finger without significant pain.  He states he feels the pain is traveling up his forearm.  Denies any fevers or chills. Denies any IVDU.  HPI     Home Medications Prior to Admission medications   Medication Sig Start Date End Date Taking? Authorizing Provider  clindamycin (CLEOCIN) 150 MG capsule Take 1 capsule (150 mg total) by mouth 4 (four) times daily. 10/17/22   Terrilee Files, MD  HYDROcodone-acetaminophen (NORCO/VICODIN) 5-325 MG tablet Take 1 tablet by mouth every 4 (four) hours as needed. 06/18/18   Burgess Amor, PA-C  ibuprofen (ADVIL) 800 MG tablet Take 1 tablet (800 mg total) by mouth 3 (three) times daily. 10/17/22   Terrilee Files, MD  mupirocin cream (BACTROBAN) 2 % Apply 1 application topically 2 (two) times daily. 03/29/20   Lorelee New, PA-C      Allergies    Patient has no known allergies.    Review of Systems   Review of Systems  Musculoskeletal:  Positive for joint swelling.    Physical Exam Updated Vital Signs BP (!) 158/97   Pulse 78   Temp 99 F (37.2 C)   Resp 20   Ht 6\' 3"  (1.905 m)   Wt 84.8 kg   SpO2 100%   BMI 23.37 kg/m  Physical Exam Vitals and nursing note reviewed.  Constitutional:      Appearance: He is well-developed.     Comments: Patient is seated at the bedside in clear discomfort and pain  HENT:     Head: Normocephalic and atraumatic.   Eyes:     Conjunctiva/sclera: Conjunctivae normal.  Pulmonary:     Effort: Pulmonary effort is normal. No respiratory distress.  Musculoskeletal:     Cervical back: Neck supple.     Comments: Significant swelling involving the right index finger contained in a partially flexed position.   Pain with passive extension. Apparent puncture like wound on the volar aspect of the proximal phalanx with what appears to be some expressible serosanguineous fluid.  Finger is warm to touch.  Some erythematous streaking along the dorsum of his hand.  Some volar forearm tenderness.   Skin:    General: Skin is warm and dry.     Capillary Refill: Capillary refill takes less than 2 seconds.  Neurological:     Mental Status: He is alert.  Psychiatric:        Mood and Affect: Mood normal.     ED Results / Procedures / Treatments   Labs (all labs ordered are listed, but only abnormal results are displayed) Labs Reviewed  CBC WITH DIFFERENTIAL/PLATELET - Abnormal; Notable for the following components:      Result Value   WBC 12.4 (*)    RBC 4.04 (*)    Hemoglobin 12.9 (*)    HCT 36.9 (*)  Neutro Abs 9.9 (*)    Monocytes Absolute 1.2 (*)    All other components within normal limits  COMPREHENSIVE METABOLIC PANEL - Abnormal; Notable for the following components:   Sodium 133 (*)    Glucose, Bld 123 (*)    All other components within normal limits    EKG None  Radiology DG Finger Index Right  Result Date: 11/28/2022 CLINICAL DATA:  Injury EXAM: RIGHT INDEX FINGER 2+V COMPARISON:  None Available. FINDINGS: No fracture or malalignment. Diffuse soft tissue swelling. No radiopaque foreign body. No soft tissue emphysema IMPRESSION: No acute osseous abnormality. Electronically Signed   By: Jasmine Pang M.D.   On: 11/28/2022 21:47    Procedures Procedures    Medications Ordered in ED Medications  ondansetron (ZOFRAN) injection 4 mg (has no administration in time range)  HYDROmorphone  (DILAUDID) injection 1 mg (has no administration in time range)  ampicillin-sulbactam (UNASYN) 1.5 g in sodium chloride 0.9 % 100 mL IVPB (0 g Intravenous Stopped 11/28/22 2141)  morphine (PF) 4 MG/ML injection 4 mg (4 mg Intravenous Given 11/28/22 2105)    ED Course/ Medical Decision Making/ A&P                                 Medical Decision Making Amount and/or Complexity of Data Reviewed Labs: ordered. Radiology: ordered.  Risk Prescription drug management. Decision regarding hospitalization.   This patient presents to the ED with chief complaint(s) of index finger pain with no pertinent past medical history  the complaint involves an extensive differential diagnosis and also carries with it a high risk of complications and morbidity.    The differential diagnosis includes  Uncomplicated cellulitis, flexor tenosynovitis, Trujillo foreign body, sprain, laceration The initial plan is to  Obtain x-rays, pain control, consult Ortho Additional history obtained: No additional historians for external records utilized Initial Assessment:   Patient in obvious discomfort.  Clinical exam and history most concerning for flexor tenosynovitis.  Independent ECG/labs interpretation:  CBC notable for leukocytosis of 12.4  Independent visualization and interpretation of imaging: I independently visualized the following imaging with scope of interpretation limited to determining acute life threatening conditions related to emergency care: Right index finger x-ray, which revealed significant soft tissue swelling with no visualized foreign body  Treatment and Reassessment: Patient given morphine 4 mg IV for pain  Consultations obtained:   Ortho, Dr. Romeo Apple  Disposition:   Plan for hospital admission for presumed flexor tenosynovitis plan for OR with Dr. Romeo Apple in the morning.  Social Determinants of Health:   None        Final Clinical Impression(s) / ED Diagnoses Final  diagnoses:  Finger pain, right    Rx / DC Orders ED Discharge Orders     None         Fabienne Bruns 11/28/22 2255    Eber Hong, MD 11/29/22 1139

## 2022-11-29 ENCOUNTER — Observation Stay (HOSPITAL_COMMUNITY): Payer: Self-pay | Admitting: Anesthesiology

## 2022-11-29 ENCOUNTER — Encounter (HOSPITAL_COMMUNITY)
Admission: EM | Disposition: A | Discharge status: Home / Self Care | Payer: Self-pay | Source: Home / Self Care | Attending: Emergency Medicine

## 2022-11-29 DIAGNOSIS — M65949 Unspecified synovitis and tenosynovitis, unspecified hand: Secondary | ICD-10-CM

## 2022-11-29 LAB — SURGICAL PCR SCREEN
MRSA, PCR: POSITIVE — AB
Staphylococcus aureus: POSITIVE — AB

## 2022-11-29 SURGERY — INCISION AND DRAINAGE OF DEEP ABSCESS, HAND
Anesthesia: General | Laterality: Right

## 2022-11-29 MED ORDER — FENTANYL CITRATE (PF) 100 MCG/2ML IJ SOLN
INTRAMUSCULAR | Status: AC
  Filled 2022-11-29: qty 2

## 2022-11-29 MED ORDER — TRAMADOL HCL 50 MG PO TABS
50.0000 mg | ORAL_TABLET | Freq: Four times a day (QID) | ORAL | Status: DC
  Administered 2022-11-29 – 2022-11-30 (×3): 50 mg via ORAL
  Filled 2022-11-29 (×4): qty 1

## 2022-11-29 MED ORDER — ACETAMINOPHEN 500 MG PO TABS
1000.0000 mg | ORAL_TABLET | Freq: Four times a day (QID) | ORAL | Status: DC
  Administered 2022-11-29 – 2022-11-30 (×2): 1000 mg via ORAL
  Filled 2022-11-29 (×3): qty 2

## 2022-11-29 MED ORDER — LIDOCAINE HCL (CARDIAC) PF 100 MG/5ML IV SOSY
PREFILLED_SYRINGE | INTRAVENOUS | Status: DC | PRN
  Administered 2022-11-29: 100 mg via INTRATRACHEAL

## 2022-11-29 MED ORDER — 0.9 % SODIUM CHLORIDE (POUR BTL) OPTIME
TOPICAL | Status: DC | PRN
  Administered 2022-11-29: 1000 mL

## 2022-11-29 MED ORDER — HYDROMORPHONE HCL 1 MG/ML IJ SOLN: 1.0000 mg | Freq: Once | INTRAMUSCULAR | Status: DC

## 2022-11-29 MED ORDER — PHENYLEPHRINE 80 MCG/ML (10ML) SYRINGE FOR IV PUSH (FOR BLOOD PRESSURE SUPPORT)
PREFILLED_SYRINGE | INTRAVENOUS | Status: AC
Start: 2022-11-29 — End: ?
  Filled 2022-11-29: qty 10

## 2022-11-29 MED ORDER — PROPOFOL 500 MG/50ML IV EMUL
INTRAVENOUS | Status: AC
  Filled 2022-11-29: qty 50

## 2022-11-29 MED ORDER — ATROPINE SULFATE 0.4 MG/ML IV SOLN
INTRAVENOUS | Status: AC
  Filled 2022-11-29: qty 1

## 2022-11-29 MED ORDER — NAPROXEN 250 MG PO TABS
250.0000 mg | ORAL_TABLET | Freq: Two times a day (BID) | ORAL | Status: DC
Start: 2022-11-29 — End: 2022-11-30
  Administered 2022-11-29: 250 mg via ORAL
  Filled 2022-11-29 (×2): qty 1

## 2022-11-29 MED ORDER — OXYCODONE HCL 5 MG PO TABS: 5.0000 mg | ORAL_TABLET | ORAL | Status: DC | PRN

## 2022-11-29 MED ORDER — EPHEDRINE 5 MG/ML INJ
INTRAVENOUS | Status: AC
  Filled 2022-11-29: qty 5

## 2022-11-29 MED ORDER — OXYCODONE HCL 5 MG/5ML PO SOLN: 5.0000 mg | Freq: Once | ORAL | Status: DC | PRN

## 2022-11-29 MED ORDER — KETOROLAC TROMETHAMINE 30 MG/ML IJ SOLN
INTRAMUSCULAR | Status: DC | PRN
  Administered 2022-11-29: 30 mg via INTRAVENOUS

## 2022-11-29 MED ORDER — KETOROLAC TROMETHAMINE 30 MG/ML IJ SOLN
INTRAMUSCULAR | Status: AC
  Filled 2022-11-29: qty 1

## 2022-11-29 MED ORDER — LIDOCAINE HCL (PF) 2 % IJ SOLN
INTRAMUSCULAR | Status: AC
  Filled 2022-11-29: qty 5

## 2022-11-29 MED ORDER — KCL IN DEXTROSE-NACL 10-5-0.45 MEQ/L-%-% IV SOLN
INTRAVENOUS | Status: DC
Start: 2022-11-29 — End: 2022-11-30
  Filled 2022-11-29: qty 1000

## 2022-11-29 MED ORDER — OXYCODONE HCL 5 MG PO TABS: 5.0000 mg | ORAL_TABLET | Freq: Once | ORAL | Status: DC | PRN

## 2022-11-29 MED ORDER — MIDAZOLAM HCL 2 MG/2ML IJ SOLN
INTRAMUSCULAR | Status: AC
  Filled 2022-11-29: qty 2

## 2022-11-29 MED ORDER — EPHEDRINE 5 MG/ML INJ
INTRAVENOUS | Status: AC
  Filled 2022-11-29: qty 10

## 2022-11-29 MED ORDER — MUPIROCIN CALCIUM 2 % EX CREA: 1.0000 | TOPICAL_CREAM | Freq: Two times a day (BID) | CUTANEOUS | Status: DC

## 2022-11-29 MED ORDER — NICOTINE 21 MG/24HR TD PT24
21.0000 mg | MEDICATED_PATCH | Freq: Every day | TRANSDERMAL | Status: DC
  Filled 2022-11-29 (×2): qty 1

## 2022-11-29 MED ORDER — FENTANYL CITRATE (PF) 100 MCG/2ML IJ SOLN
INTRAMUSCULAR | Status: DC | PRN
  Administered 2022-11-29: 50 ug via INTRAVENOUS

## 2022-11-29 MED ORDER — ONDANSETRON HCL 4 MG/2ML IJ SOLN
INTRAMUSCULAR | Status: AC
  Filled 2022-11-29: qty 2

## 2022-11-29 MED ORDER — FENTANYL CITRATE PF 50 MCG/ML IJ SOSY: 25.0000 ug | PREFILLED_SYRINGE | INTRAMUSCULAR | Status: DC | PRN

## 2022-11-29 MED ORDER — SODIUM CHLORIDE 0.9 % IV SOLN
3.0000 g | Freq: Four times a day (QID) | INTRAVENOUS | Status: DC
  Administered 2022-11-29 – 2022-11-30 (×4): 3 g via INTRAVENOUS
  Filled 2022-11-29 (×9): qty 8

## 2022-11-29 MED ORDER — PHENYLEPHRINE HCL (PRESSORS) 10 MG/ML IV SOLN
INTRAVENOUS | Status: DC | PRN
  Administered 2022-11-29: 80 ug via INTRAVENOUS

## 2022-11-29 MED ORDER — OXYCODONE HCL 5 MG PO TABS
10.0000 mg | ORAL_TABLET | ORAL | Status: DC | PRN
  Administered 2022-11-29 (×2): 10 mg via ORAL
  Administered 2022-11-29: 15 mg via ORAL
  Administered 2022-11-30: 10 mg via ORAL
  Filled 2022-11-29: qty 3
  Filled 2022-11-29 (×3): qty 2

## 2022-11-29 MED ORDER — BUPIVACAINE HCL (PF) 0.5 % IJ SOLN
INTRAMUSCULAR | Status: AC
  Filled 2022-11-29: qty 30

## 2022-11-29 MED ORDER — BUPIVACAINE HCL (PF) 0.5 % IJ SOLN
INTRAMUSCULAR | Status: DC | PRN
  Administered 2022-11-29: 30 mL

## 2022-11-29 MED ORDER — EPHEDRINE SULFATE (PRESSORS) 50 MG/ML IJ SOLN
INTRAMUSCULAR | Status: DC | PRN
  Administered 2022-11-29 (×4): 10 mg via INTRAVENOUS

## 2022-11-29 MED ORDER — DEXAMETHASONE SODIUM PHOSPHATE 10 MG/ML IJ SOLN
INTRAMUSCULAR | Status: AC
  Filled 2022-11-29: qty 1

## 2022-11-29 MED ORDER — DEXAMETHASONE SODIUM PHOSPHATE 10 MG/ML IJ SOLN
INTRAMUSCULAR | Status: DC | PRN
  Administered 2022-11-29: 5 mg via INTRAVENOUS

## 2022-11-29 MED ORDER — PROPOFOL 10 MG/ML IV BOLUS
INTRAVENOUS | Status: DC | PRN
  Administered 2022-11-29: 200 mg via INTRAVENOUS

## 2022-11-29 SURGICAL SUPPLY — 32 items
BLADE SURG SZ11 CARB STEEL (BLADE) IMPLANT
BNDG ELASTIC 2 VLCR STRL LF (GAUZE/BANDAGES/DRESSINGS) IMPLANT
BNDG ELASTIC 2INX 5YD STR LF (GAUZE/BANDAGES/DRESSINGS) IMPLANT
BNDG ELASTIC 3INX 5YD STR LF (GAUZE/BANDAGES/DRESSINGS) IMPLANT
BNDG ELASTIC 4X5.8 VLCR NS LF (GAUZE/BANDAGES/DRESSINGS) IMPLANT
BNDG GAUZE DERMACEA FLUFF 4 (GAUZE/BANDAGES/DRESSINGS) IMPLANT
BNDG GAUZE ROLL STR 2.25X3YD (GAUZE/BANDAGES/DRESSINGS) IMPLANT
BNDG STRETCH GAUZE 3IN X12FT (GAUZE/BANDAGES/DRESSINGS) IMPLANT
CLOTH BEACON ORANGE TIMEOUT ST (SAFETY) IMPLANT
COVER LIGHT HANDLE STERIS (MISCELLANEOUS) IMPLANT
COVER MAYO STAND XLG (MISCELLANEOUS) IMPLANT
CUFF TOURN SGL QUICK 24 (TOURNIQUET CUFF) ×1
CUFF TRNQT CYL 24X4X40X1 (TOURNIQUET CUFF) IMPLANT
ELECT REM PT RETURN 9FT ADLT (ELECTROSURGICAL) ×1
ELECTRODE REM PT RTRN 9FT ADLT (ELECTROSURGICAL) IMPLANT
GAUZE SPONGE 4X4 12PLY STRL (GAUZE/BANDAGES/DRESSINGS) IMPLANT
GAUZE XEROFORM 1X8 LF (GAUZE/BANDAGES/DRESSINGS) IMPLANT
GOWN STRL REUS W/TWL LRG LVL3 (GOWN DISPOSABLE) IMPLANT
KIT TURNOVER KIT A (KITS) IMPLANT
MANIFOLD NEPTUNE II (INSTRUMENTS) IMPLANT
NDL HYPO 21X1 ECLIPSE (NEEDLE) IMPLANT
NEEDLE HYPO 21X1 ECLIPSE (NEEDLE) ×2 IMPLANT
NS IRRIG 1000ML POUR BTL (IV SOLUTION) IMPLANT
PACK BASIC LIMB (CUSTOM PROCEDURE TRAY) IMPLANT
PAD ARMBOARD 7.5X6 YLW CONV (MISCELLANEOUS) IMPLANT
POSITIONER HEAD 8X9X4 ADT (SOFTGOODS) IMPLANT
SET BASIN LINEN APH (SET/KITS/TRAYS/PACK) IMPLANT
SUT ETHILON 3 0 PS 1 (SUTURE) IMPLANT
SWAB CULTURE LIQ STUART DBL (MISCELLANEOUS) IMPLANT
SYR 10ML LL (SYRINGE) IMPLANT
SYR 20ML LL LF (SYRINGE) IMPLANT
SYR BULB IRRIG 60ML STRL (SYRINGE) IMPLANT

## 2022-11-29 NOTE — Plan of Care (Signed)
  Problem: Education: Goal: Knowledge of General Education information will improve Description: Including pain rating scale, medication(s)/side effects and non-pharmacologic comfort measures Outcome: Adequate for Discharge   Problem: Health Behavior/Discharge Planning: Goal: Ability to manage health-related needs will improve Outcome: Adequate for Discharge   Problem: Clinical Measurements: Goal: Ability to maintain clinical measurements within normal limits will improve Outcome: Adequate for Discharge   Problem: Nutrition: Goal: Adequate nutrition will be maintained Outcome: Adequate for Discharge

## 2022-11-29 NOTE — Interval H&P Note (Signed)
History and Physical Interval Note:  11/29/2022 7:08 AM  Blake Trujillo  has presented today for surgery, with the diagnosis of pyogenic flexor tenosynovitis right index finger.  The various methods of treatment have been discussed with the patient and family. After consideration of risks, benefits and other options for treatment, the patient has consented to  Procedure(s) with comments: INCISION AND DRAINAGE OF DEEP ABSCESS, HAND (Right) - need small catheter as a surgical intervention.  The patient's history has been reviewed, patient examined, no change in status, stable for surgery.  I have reviewed the patient's chart and labs.  Questions were answered to the patient's satisfaction.     Fuller Canada

## 2022-11-29 NOTE — Progress Notes (Signed)
Pt was smoking in room, pt was advised that he was not allowed to smoke inside the hospital due to safety reasons. Pt verbalized understanding. Pt's nurse removed cigarettes & lighter from room with pt permission and placed in pt bin in med room.

## 2022-11-29 NOTE — Progress Notes (Signed)
Subjective: Pain RIF    Objective: Vital signs in last 24 hours: Temp:  [98 F (36.7 C)-99 F (37.2 C)] 98 F (36.7 C) (11/16 0510) Pulse Rate:  [66-97] 72 (11/16 0510) Resp:  [18-20] 20 (11/16 0510) BP: (136-158)/(60-97) 136/60 (11/16 0510) SpO2:  [99 %-100 %] 100 % (11/16 0510) Weight:  [82.8 kg-84.8 kg] 82.8 kg (11/15 2244)  Intake/Output from previous day: 11/15 0701 - 11/16 0700 In: 198.2 [IV Piggyback:198.2] Out: -  Intake/Output this shift: No intake/output data recorded.  Recent Labs    11/28/22 2056  HGB 12.9*   Recent Labs    11/28/22 2056  WBC 12.4*  RBC 4.04*  HCT 36.9*  PLT 291   Recent Labs    11/28/22 2056  NA 133*  K 3.9  CL 100  CO2 24  BUN 10  CREATININE 0.78  GLUCOSE 123*  CALCIUM 9.4   No results for input(s): "LABPT", "INR" in the last 72 hours.  No significant improvement in the flexor tenosynovitis of the right index finger    Assessment/Plan: Recommend incision and drainage  The procedure has been fully reviewed with the patient; The risks and benefits of surgery have been discussed and explained and understood. Alternative treatment has also been reviewed, questions were encouraged and answered. The postoperative plan is also been reviewed.      Fuller Canada 11/29/2022, 7:07 AM

## 2022-11-29 NOTE — Plan of Care (Signed)
  Problem: Coping: Goal: Level of anxiety will decrease Outcome: Not Progressing   Problem: Pain Management: Goal: General experience of comfort will improve Outcome: Not Progressing

## 2022-11-29 NOTE — H&P (View-Only) (Signed)
Subjective: Pain RIF    Objective: Vital signs in last 24 hours: Temp:  [98 F (36.7 C)-99 F (37.2 C)] 98 F (36.7 C) (11/16 0510) Pulse Rate:  [66-97] 72 (11/16 0510) Resp:  [18-20] 20 (11/16 0510) BP: (136-158)/(60-97) 136/60 (11/16 0510) SpO2:  [99 %-100 %] 100 % (11/16 0510) Weight:  [82.8 kg-84.8 kg] 82.8 kg (11/15 2244)  Intake/Output from previous day: 11/15 0701 - 11/16 0700 In: 198.2 [IV Piggyback:198.2] Out: -  Intake/Output this shift: No intake/output data recorded.  Recent Labs    11/28/22 2056  HGB 12.9*   Recent Labs    11/28/22 2056  WBC 12.4*  RBC 4.04*  HCT 36.9*  PLT 291   Recent Labs    11/28/22 2056  NA 133*  K 3.9  CL 100  CO2 24  BUN 10  CREATININE 0.78  GLUCOSE 123*  CALCIUM 9.4   No results for input(s): "LABPT", "INR" in the last 72 hours.  No significant improvement in the flexor tenosynovitis of the right index finger    Assessment/Plan: Recommend incision and drainage  The procedure has been fully reviewed with the patient; The risks and benefits of surgery have been discussed and explained and understood. Alternative treatment has also been reviewed, questions were encouraged and answered. The postoperative plan is also been reviewed.      Fuller Canada 11/29/2022, 7:07 AM

## 2022-11-29 NOTE — Brief Op Note (Signed)
11/28/2022 - 11/29/2022  9:19 AM  PATIENT:  Blake Trujillo  32 y.o. male  PRE-OPERATIVE DIAGNOSIS:  pyogenic flexor tenosynovitis right index finger  POST-OPERATIVE DIAGNOSIS:  pyogenic flexor tenosynovitis right index finger  PROCEDURE:  Procedure(s) with comments: INCISION AND DRAINAGE OF DEEP ABSCESS, HAND (Right) - need small catheter  SURGEON:  Surgeons and Role:    * Vickki Hearing, MD - Primary  PHYSICIAN ASSISTANT:   ASSISTANTS: none   ANESTHESIA:   general LMA  EBL:  25 mL   BLOOD ADMINISTERED:none  DRAINS: none   LOCAL MEDICATIONS USED:  MARCAINE     SPECIMEN:  Source of Specimen:  Flexor tendon sheath  DISPOSITION OF SPECIMEN:   Microbiology  COUNTS:  YES  TOURNIQUET:   Total Tourniquet Time Documented: area (laterality) - 61 minutes Total: area (laterality) - 61 minutes   DICTATION: .Dragon Dictation  PLAN OF CARE: Admit for overnight observation  PATIENT DISPOSITION:  PACU - hemodynamically stable.   Delay start of Pharmacological VTE agent (>24hrs) due to surgical blood loss or risk of bleeding: not applicable

## 2022-11-29 NOTE — Op Note (Signed)
11/29/2022  9:19 AM  PATIENT:  Blake Trujillo  32 y.o. male  PRE-OPERATIVE DIAGNOSIS:  pyogenic flexor tenosynovitis right index finger  POST-OPERATIVE DIAGNOSIS:  pyogenic flexor tenosynovitis right index finger  PROCEDURE:  Procedure(s) with comments: INCISION AND DRAINAGE OF DEEP ABSCESS, HAND (Right) - need small catheter  Findings:  Purulent drainage from the puncture site at the distal fingertip pad and then there was purulent drainage in the flexor tendon sheath  Details of procedure  The patient was reevaluated in the PACU and cleared for surgery.  The surgical site was confirmed and marked his right index finger.  The patient was taken to the operating for general anesthesia followed by sterile prep and drape and timeout  The limb was exsanguinated by elevation and then the tourniquet was elevated to 250 mmHg  The distal incision was made at the area of purulence and immediately purulent drainage was expressed and cultured.  Then used a hemostat to bluntly dissect in the subcutaneous plane between the flexor tendon sheath and expressed some purulent drainage again from the finger  After irrigating this I then proceeded to make a transverse incision at the area of the A1 pulley in the palm.  Blunt dissection was carried out to protect the neurovascular structures and the flexor tendon sheath was opened and purulent drainage was noted there.  I then took an Angiocath and placed it under the A1 pulley and flushed the flexor tendon sheath until fluid was expressed distally.  I then flushed it from distal to proximal.  The ulnar branch of the digital nerve to the index finger was explored and was never definitively identified.  It is possible that this was cut during the dissection.  This will be monitored postoperatively.  The wound was irrigated again the wound was closed with 3-0 nylon interrupted sutures and digital block and subcutaneous block was performed with Marcaine  total of 20 cc  The tourniquet was released and the finger had good viability and color.  Sterile dressing was applied.  The patient was extubated and taken to recovery room in stable condition.    SURGEON:  Surgeons and Role:    * Vickki Hearing, MD - Primary  PHYSICIAN ASSISTANT:   ASSISTANTS: none   ANESTHESIA:   general LMA  EBL:  25 mL   BLOOD ADMINISTERED:none  DRAINS: none   LOCAL MEDICATIONS USED:  MARCAINE     SPECIMEN:  Source of Specimen:  Flexor tendon sheath  DISPOSITION OF SPECIMEN:  Microbiology  COUNTS:  YES  TOURNIQUET:   Total Tourniquet Time Documented: area (laterality) - 61 minutes Total: area (laterality) - 61 minutes   DICTATION: .Reubin Milan Dictation  PLAN OF CARE: Admit for overnight observation  PATIENT DISPOSITION:  PACU - hemodynamically stable.   Delay start of Pharmacological VTE agent (>24hrs) due to surgical blood loss or risk of bleeding: not applicable  Plan  Continue IV antibiotics Pain control Check culture results Change dressing tomorrow Plan for discharge tomorrow

## 2022-11-29 NOTE — Progress Notes (Signed)
Patient MRSA, PCR + and Staphylococcus aureus +. Mupirocin started and given.

## 2022-11-29 NOTE — Progress Notes (Signed)
Patient refused for nurse to give CHG bath, stated he would give it to himself later that right now he wanted to go back to sleep.

## 2022-11-29 NOTE — Progress Notes (Signed)
Pt called nursing station for pain med. When nurse entered room, pt was sitting on the floor yelling. He told nurse that he did not 'fucking' want the med because we aren't doing anything to help him and stated that he would rather leave. This RN to room with AMA form and pt proceeded to cuss at me. Yelling at me to "get the fuck out". Security was called and he also cussed out security guards. He laid back in the bed why yelling and cussing at [place] to get out. Seems patient decided to stay for now.

## 2022-11-29 NOTE — Anesthesia Postprocedure Evaluation (Signed)
Anesthesia Post Note  Patient: Blake Trujillo  Procedure(s) Performed: INCISION AND DRAINAGE OF DEEP ABSCESS, HAND (Right)  Patient location during evaluation: PACU Anesthesia Type: General Level of consciousness: awake and alert Pain management: pain level controlled Vital Signs Assessment: post-procedure vital signs reviewed and stable Respiratory status: spontaneous breathing, nonlabored ventilation, respiratory function stable and patient connected to nasal cannula oxygen Cardiovascular status: blood pressure returned to baseline and stable Postop Assessment: no apparent nausea or vomiting Anesthetic complications: no   There were no known notable events for this encounter.   Last Vitals:  Vitals:   11/29/22 1000 11/29/22 1025  BP: 113/74 123/75  Pulse: 74 75  Resp: 17 16  Temp:  36.6 C  SpO2: 97% 90%    Last Pain:  Vitals:   11/29/22 1025  TempSrc: Oral  PainSc:                  [name]

## 2022-11-29 NOTE — Progress Notes (Signed)
Patient returned from surgery, stating he wants to go outside for a cigarette, offered nicotine patch and patient declined. Patient says he will be leaving by 1600 today whether we want him to leave or not. MD Romeo Apple notified.

## 2022-11-29 NOTE — Transfer of Care (Signed)
Immediate Anesthesia Transfer of Care Note  Patient: Blake Trujillo  Procedure(s) Performed: INCISION AND DRAINAGE OF DEEP ABSCESS, HAND (Right)  Patient Location: PACU  Anesthesia Type:General  Level of Consciousness: sedated  Airway & Oxygen Therapy: non-rebreather face mask  Post-op Assessment: Post -op Vital signs reviewed and stable  Post vital signs: stable  Last Vitals:  Vitals Value Taken Time  BP    Temp    Pulse    Resp    SpO2      Last Pain:  Vitals:   11/29/22 0520  TempSrc:   PainSc: Asleep         Complications: There were no known notable events for this encounter.

## 2022-11-29 NOTE — Anesthesia Preprocedure Evaluation (Signed)
Anesthesia Evaluation  Patient identified by MRN, date of birth, ID band Patient awake    Reviewed: Allergy & Precautions, H&P , NPO status , Patient's Chart, lab work & pertinent test results, reviewed documented beta blocker date and time   Airway Mallampati: II  TM Distance: >3 FB Neck ROM: full    Dental no notable dental hx. (+) Dental Advisory Given, Teeth Intact   Pulmonary asthma , Current Smoker   Pulmonary exam normal breath sounds clear to auscultation       Cardiovascular Exercise Tolerance: Good negative cardio ROS Normal cardiovascular exam Rhythm:regular Rate:Normal     Neuro/Psych  PSYCHIATRIC DISORDERS      ADHDnegative neurological ROS     GI/Hepatic negative GI ROS, Neg liver ROS,,,  Endo/Other  negative endocrine ROS    Renal/GU negative Renal ROS  negative genitourinary   Musculoskeletal   Abdominal   Peds  Hematology negative hematology ROS (+)   Anesthesia Other Findings   Reproductive/Obstetrics negative OB ROS                             Anesthesia Physical Anesthesia Plan  ASA: 2  Anesthesia Plan: General   Post-op Pain Management:    Induction: Intravenous  PONV Risk Score and Plan: Ondansetron, Dexamethasone and Midazolam  Airway Management Planned: LMA  Additional Equipment: None  Intra-op Plan:   Post-operative Plan: Extubation in OR  Informed Consent: I have reviewed the patients History and Physical, chart, labs and discussed the procedure including the risks, benefits and alternatives for the proposed anesthesia with the patient or authorized representative who has indicated his/her understanding and acceptance.     Dental Advisory Given  Plan Discussed with: CRNA and Surgeon  Anesthesia Plan Comments:        Anesthesia Quick Evaluation

## 2022-11-29 NOTE — Progress Notes (Signed)
Patient has been in a lot of pain. Bent over crying. He has been given IV Dilaudid three times this shift. Pain rate between 8-10.

## 2022-11-30 MED ORDER — DOXYCYCLINE HYCLATE 100 MG PO TABS: 100.0000 mg | ORAL_TABLET | Freq: Two times a day (BID) | ORAL | 1 refills | Status: DC

## 2022-11-30 MED ORDER — OXYCODONE HCL 5 MG PO TABS: 5.0000 mg | ORAL_TABLET | ORAL | 0 refills | Status: AC | PRN

## 2022-11-30 MED ORDER — IBUPROFEN 800 MG PO TABS: 800.0000 mg | ORAL_TABLET | Freq: Three times a day (TID) | ORAL | 1 refills | Status: DC | PRN

## 2022-11-30 NOTE — Discharge Summary (Signed)
Physician Discharge Summary  Patient ID: Blake Trujillo MRN: [id number] DOB/AGE: 30-16-1992 30 y.o.  Admit date: 11/28/2022 Discharge date: 11/30/2022  Admission Diagnoses: Pyogenic flexor tenosynovitis right index finger  Discharge Diagnoses:  Principal Problem:   Flexor tenosynovitis of finger   Discharged Condition: good  Hospital Course: Patient came in on 15 November was started on a trial of IV antibiotics with Unasyn.  He did not improve and on the 16th November had incision and drainage of pyogenic flexor tenosynovitis with culture intraoperatively.  Purulence was noted in the flexor tendon sheath as well as in the subcu tissue  He had excellent relief of pain with the surgery and on postop day 1 his dressing looks good his swelling was down he was still having some pain in the finger with stiffness as expected  Consults: None  Significant Diagnostic Studies: microbiology: wound culture: positive for gram-positive cocci  Treatments: surgery: Incision and drainage right index finger  Discharge Exam: Blood pressure 124/76, pulse (!) 58, temperature 97.7 F (36.5 C), temperature source Oral, resp. rate 15, height 6\' 3"  (1.905 m), weight 82.8 kg, SpO2 98%. He is awake he is alert he is comfortable and calm his wound looks clean his dressing was changed  Disposition: Discharge disposition: 01-Home or Self Care       Discharge Instructions     Call MD / Call 911   Complete by: As directed    If you experience chest pain or shortness of breath, CALL 911 and be transported to the hospital emergency room.  If you develope a fever above 101 F, pus (white drainage) or increased drainage or redness at the wound, or calf pain, call your surgeon's office.   Constipation Prevention   Complete by: As directed    Drink plenty of fluids.  Prune juice may be helpful.  You may use a stool softener, such as Colace (over the counter) 100 mg twice a day.  Use MiraLax (over the  counter) for constipation as needed.   Diet - low sodium heart healthy   Complete by: As directed    Discharge instructions   Complete by: As directed    Resting clean and dry  If you want to take a shower you would need to place a glove over the finger and take the bottom of the glove to your forearm and keep your hand elevated  Keep the hand elevated is much as possible to control swelling   Take oxycodone and ibuprofen for pain The ibuprofen works better for the throbbing sensation you will feel  Return to work Monday, December 01, 2022 light duty   Increase activity slowly as tolerated   Complete by: As directed    Post-operative opioid taper instructions:   Complete by: As directed    POST-OPERATIVE OPIOID TAPER INSTRUCTIONS: It is important to wean off of your opioid medication as soon as possible. If you do not need pain medication after your surgery it is ok to stop day one. Opioids include: Codeine, Hydrocodone(Norco, Vicodin), Oxycodone(Percocet, oxycontin) and hydromorphone amongst others.  Long term and even short term use of opiods can cause: Increased pain response Dependence Constipation Depression Respiratory depression And more.  Withdrawal symptoms can include Flu like symptoms Nausea, vomiting And more Techniques to manage these symptoms Hydrate well Eat regular healthy meals Stay active Use relaxation techniques(deep breathing, meditating, yoga) Do Not substitute Alcohol to help with tapering If you have been on opioids for less than two weeks and do not  have pain than it is ok to stop all together.  Plan to wean off of opioids This plan should start within one week post op of your joint replacement. Maintain the same interval or time between taking each dose and first decrease the dose.  Cut the total daily intake of opioids by one tablet each day Next start to increase the time between doses. The last dose that should be eliminated is the evening  dose.         Allergies as of 11/30/2022   No Known Allergies      Medication List     STOP taking these medications    clindamycin 150 MG capsule Commonly known as: CLEOCIN   HYDROcodone-acetaminophen 5-325 MG tablet Commonly known as: NORCO/VICODIN       TAKE these medications    doxycycline 100 MG tablet Commonly known as: VIBRA-TABS Take 1 tablet (100 mg total) by mouth 2 (two) times daily.   ibuprofen 800 MG tablet Commonly known as: ADVIL Take 1 tablet (800 mg total) by mouth every 8 (eight) hours as needed. What changed:  medication strength how much to take when to take this reasons to take this   oxyCODONE 5 MG immediate release tablet Commonly known as: Oxy IR/ROXICODONE Take 1 tablet (5 mg total) by mouth every 4 (four) hours as needed for up to 5 days for severe pain (pain score 7-10).        Follow-up Information     Vickki Hearing, MD Follow up on 12/02/2022.   Specialties: Orthopedic Surgery, Radiology Why: For wound re-check Contact information: [address] [place] [address] [phone]                 Signed: Fuller Canada 11/30/2022, 7:37 AM

## 2022-11-30 NOTE — Progress Notes (Signed)
Patient ID: Blake Trujillo, male   DOB: 01-15-1990, 32 y.o.   MRN: [id number] Postop day 1 status post incision and drainage of pyogenic tenosynovitis right index finger  BP 124/76 (BP Location: Left Arm)   Pulse (!) 58   Temp 97.7 F (36.5 C) (Oral)   Resp 15   Ht 6\' 3"  (1.905 m)   Wt 82.8 kg   SpO2 98%   BMI 22.82 kg/m   Patient doing well pain much better controlled dressing changed wound looks clean no excessive drainage  Cleared for discharge today on antibiotics p.o. pain medication and return to work light duty

## 2022-11-30 NOTE — Plan of Care (Signed)
  Problem: Coping: Goal: Level of anxiety will decrease Outcome: Progressing   Problem: Pain Management: Goal: General experience of comfort will improve Outcome: Progressing

## 2022-11-30 NOTE — Discharge Instructions (Signed)
To work Monday, December 01, 2022 light duty x 4 weeks

## 2022-12-01 ENCOUNTER — Telehealth: Payer: Self-pay | Admitting: Orthopedic Surgery

## 2022-12-01 NOTE — Telephone Encounter (Signed)
All Administrations of fentaNYL (SUBLIMAZE) injection  Administration Action Time Documented By Ordered By Site Comment Reason Patient Supplied  50 mcg :   : Intravenous 11/29/22 0754 Recorded Time 11/29/22 0754 [place], C        Patient did receive it. Put in latter she can pick up

## 2022-12-01 NOTE — Telephone Encounter (Signed)
Dr. Mort Sawyers pt - Blake Trujillo, pt's mother [phone] lvm stating the patient saw his probation officer today and she needs a letter stating that Dr. Romeo Apple gave the patient Fentanyl (that's what it sounded like she said) while he was in the was in the hospital bc she doesn't believe him, stated it showed up on the drug test.

## 2022-12-02 ENCOUNTER — Encounter: Payer: Self-pay | Admitting: Orthopedic Surgery

## 2022-12-02 DIAGNOSIS — M65949 Unspecified synovitis and tenosynovitis, unspecified hand: Secondary | ICD-10-CM

## 2022-12-03 ENCOUNTER — Encounter: Payer: Self-pay | Admitting: Orthopaedic Surgery

## 2022-12-04 LAB — AEROBIC/ANAEROBIC CULTURE W GRAM STAIN (SURGICAL/DEEP WOUND): Gram Stain: NONE SEEN

## 2022-12-09 ENCOUNTER — Telehealth: Payer: Self-pay | Admitting: Orthopedic Surgery

## 2022-12-09 NOTE — Telephone Encounter (Signed)
-----   Message from Fuller Canada sent at 11/30/2022  7:36 AM EST ----- Instructions for Dr. Karlton Lemon in my absence

## 2023-12-16 ENCOUNTER — Emergency Department (HOSPITAL_COMMUNITY)
Admission: EM | Admit: 2023-12-16 | Discharge: 2023-12-16 | Discharge status: Against Medical Advice | Payer: Self-pay | Attending: Emergency Medicine | Admitting: Emergency Medicine

## 2023-12-16 ENCOUNTER — Other Ambulatory Visit: Payer: Self-pay

## 2023-12-16 ENCOUNTER — Encounter (HOSPITAL_COMMUNITY): Payer: Self-pay

## 2023-12-16 DIAGNOSIS — L03211 Cellulitis of face: Secondary | ICD-10-CM

## 2023-12-16 LAB — CBC WITH DIFFERENTIAL/PLATELET
Abs Immature Granulocytes: 0.02 K/uL (ref 0.00–0.07)
Basophils Absolute: 0 K/uL (ref 0.0–0.1)
Basophils Relative: 0 %
Eosinophils Absolute: 0.2 K/uL (ref 0.0–0.5)
Eosinophils Relative: 2 %
HCT: 45 % (ref 39.0–52.0)
Hemoglobin: 15.1 g/dL (ref 13.0–17.0)
Immature Granulocytes: 0 %
Lymphocytes Relative: 19 %
Lymphs Abs: 1.8 K/uL (ref 0.7–4.0)
MCH: 31.3 pg (ref 26.0–34.0)
MCHC: 33.6 g/dL (ref 30.0–36.0)
MCV: 93.4 fL (ref 80.0–100.0)
Monocytes Absolute: 0.9 K/uL (ref 0.1–1.0)
Monocytes Relative: 9 %
Neutro Abs: 6.8 K/uL (ref 1.7–7.7)
Neutrophils Relative %: 70 %
Platelets: 365 K/uL (ref 150–400)
RBC: 4.82 MIL/uL (ref 4.22–5.81)
RDW: 14.1 % (ref 11.5–15.5)
WBC: 9.7 K/uL (ref 4.0–10.5)
nRBC: 0 % (ref 0.0–0.2)

## 2023-12-16 LAB — BASIC METABOLIC PANEL WITH GFR
Anion gap: 13 (ref 5–15)
BUN: 17 mg/dL (ref 6–20)
CO2: 23 mmol/L (ref 22–32)
Calcium: 10.1 mg/dL (ref 8.9–10.3)
Chloride: 100 mmol/L (ref 98–111)
Creatinine, Ser: 0.95 mg/dL (ref 0.61–1.24)
GFR, Estimated: >60 mL/min (ref >60)
Glucose, Bld: 140 mg/dL — ABNORMAL HIGH (ref 70–99)
Potassium: 5.2 mmol/L — ABNORMAL HIGH (ref 3.5–5.1)
Sodium: 136 mmol/L (ref 135–145)

## 2023-12-16 MED ORDER — CEFAZOLIN SODIUM-DEXTROSE 2-4 GM/100ML-% IV SOLN
2.0000 g | Freq: Once | INTRAVENOUS | Status: AC
  Administered 2023-12-16: 2 g via INTRAVENOUS
  Filled 2023-12-16: qty 100

## 2023-12-16 NOTE — ED Provider Notes (Signed)
 [place] EMERGENCY DEPARTMENT AT [facility]: [facility] Provider Note   CSN: [id number] Arrival date & time: 12/16/23  9157     Patient presents with: Facial Swelling   Blake Trujillo is a 33 y.o. male.   HPI      Blake Trujillo is a 33 y.o. male who presents to the Emergency Department complaining of pain and swelling localized to the left eyebrow area.  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  Believes he may have been bitten by a spider, but unsure.  Swelling spreading to his left upper eyelid area.  He denies fever, visual changes and headache.  Seen recently at [facility] and given abx but symptoms are worsening.    Prior to Admission medications   Medication Sig Start Date End Date Taking? Authorizing Provider  doxycycline  (VIBRA -TABS) 100 MG tablet Take 1 tablet (100 mg total) by mouth 2 (two) times daily. 11/30/22   Margrette Taft BRAVO, MD  ibuprofen  (ADVIL ) 800 MG tablet Take 1 tablet (800 mg total) by mouth every 8 (eight) hours as needed. 11/30/22   Margrette Taft BRAVO, MD    Allergies: Penicillins    Review of Systems  HENT:         Swelling redness to left eyebrow area  All other systems reviewed and are negative.   Updated Vital Signs Temp 98.3 F (36.8 C) (Oral)   Resp 17   Ht 6' 4 (1.93 m)   Wt 83.9 kg   BMI 22.52 kg/m   Physical Exam Vitals and nursing note reviewed.  Constitutional:      General: He is not in acute distress.    Appearance: Normal appearance. He is not toxic-appearing.  HENT:     Head:     Comments: 2 cm area of induration to left medial eyebrow/glabella.  No drainage or fluctuance.  Mild edema to the left upper eyelid.  No erythema.     Mouth/Throat:     Mouth: Mucous membranes are moist.  Eyes:     Extraocular Movements: Extraocular movements intact.     Conjunctiva/sclera: Conjunctivae normal.     Pupils: Pupils are equal, round, and reactive to light.  Cardiovascular:     Rate and Rhythm: Normal rate and regular rhythm.      Pulses: Normal pulses.  Pulmonary:     Effort: Pulmonary effort is normal.  Musculoskeletal:     Cervical back: Normal range of motion. No tenderness.  Lymphadenopathy:     Cervical: No cervical adenopathy.  Neurological:     General: No focal deficit present.     Mental Status: He is alert.     Motor: No weakness.     (all labs ordered are listed, but only abnormal results are displayed) Labs Reviewed - No data to display  EKG: None  Radiology: No results found.   Procedures   Medications Ordered in the ED - No data to display                                  Medical Decision Making Patient here with likely developing abscess to the left eyebrow area.  He is on oral antibiotics since yesterday. He does have some swelling of his left upper lid and at the area of the glabella.  No significant erythema.  No fluctuance,. No trismus or tenderness of the lower face.   Amount and/or Complexity of Data Reviewed Labs: ordered.  Details: Labs unremarkable Discussion of management or test interpretation with external provider(s):   When I went back to reassess the patient after his IV antibiotics had finished, patient no longer in the room.  Nurse went to find the patient and he apparently eloped.  I was notified by nurse that she called his cell phone and pt states that he left, and took his IV out himself.  used IV catheter was found in the trash adjacent to his bed.    Risk Prescription drug management.        Final diagnoses:  Facial cellulitis    ED Discharge Orders     None          Herlinda Milling, PA-C 12/19/23 1210    Yolande Lamar BROCKS, MD 12/22/23 [phone]

## 2023-12-16 NOTE — ED Notes (Signed)
 Pt noted to have walked out without telling anyone. Nurse called pt and he said he took his IV out . Nurse asked pt to please tell staff if he decides to leave. Pt stated   I was there for like a hour and half.

## 2023-12-16 NOTE — ED Triage Notes (Signed)
 Pt arrived POV with c/o left eye swelling from possible insect bite. Pt saw red area Sun and went to UC yesterday, pt received ABT but swelling isn't improving.

## 2023-12-17 ENCOUNTER — Encounter (HOSPITAL_COMMUNITY): Payer: Self-pay | Admitting: *Deleted

## 2023-12-17 ENCOUNTER — Emergency Department (HOSPITAL_COMMUNITY)
Admission: EM | Admit: 2023-12-17 | Discharge: 2023-12-17 | Disposition: A | Discharge status: Home / Self Care | Payer: Self-pay | Attending: Emergency Medicine | Admitting: Emergency Medicine

## 2023-12-17 ENCOUNTER — Other Ambulatory Visit: Payer: Self-pay

## 2023-12-17 DIAGNOSIS — L0201 Cutaneous abscess of face: Secondary | ICD-10-CM | POA: Insufficient documentation

## 2023-12-17 MED ORDER — LIDOCAINE HCL (PF) 2 % IJ SOLN
INTRAMUSCULAR | Status: AC
  Filled 2023-12-17: qty 5

## 2023-12-17 MED ORDER — CLINDAMYCIN HCL 300 MG PO CAPS: 300.0000 mg | ORAL_CAPSULE | Freq: Three times a day (TID) | ORAL | 0 refills | Status: AC

## 2023-12-17 MED ORDER — LIDOCAINE HCL (PF) 2 % IJ SOLN: 5.0000 mL | Freq: Once | INTRAMUSCULAR | Status: DC

## 2023-12-17 MED ORDER — LIDOCAINE-EPINEPHRINE-TETRACAINE (LET) TOPICAL GEL
3.0000 mL | Freq: Once | TOPICAL | Status: AC
  Administered 2023-12-17: 3 mL via TOPICAL
  Filled 2023-12-17: qty 3

## 2023-12-17 MED ORDER — POVIDONE-IODINE 10 % EX SOLN
CUTANEOUS | Status: DC | PRN
  Administered 2023-12-17: 1 via TOPICAL
  Filled 2023-12-17: qty 14.8

## 2023-12-17 MED ORDER — IBUPROFEN 800 MG PO TABS: 800.0000 mg | ORAL_TABLET | Freq: Three times a day (TID) | ORAL | 0 refills | Status: AC

## 2023-12-17 MED ORDER — CLINDAMYCIN PHOSPHATE 600 MG/50ML IV SOLN
600.0000 mg | Freq: Once | INTRAVENOUS | Status: AC
  Administered 2023-12-17: 600 mg via INTRAVENOUS
  Filled 2023-12-17: qty 50

## 2023-12-17 NOTE — ED Triage Notes (Signed)
 Pt was seen here yesterday for same complaint and given IV antibiotics with no improvement; pt has significant swelling to left eye  Pt states last night the wound had some yellow drainage

## 2023-12-17 NOTE — ED Provider Notes (Signed)
 [place] EMERGENCY DEPARTMENT AT [facility] Provider Note   CSN: [id number] Arrival date & time: 12/17/23  9092     Patient presents with: Facial Swelling   Blake Trujillo is a 33 y.o. male.   HPI      Blake Trujillo is a 33 y.o. male who presents to the Emergency Department for pain and swelling to his left eyebrow area.  He was seen 1 day prior also by me for evaluation of same symptoms.  He received IV antibiotics and eloped prior to completion of his workup.  He returns due to no improvement of his symptoms.  He was also previously seen at urgent care and prescribed antibiotics which she states are not helping.  He states his pain and swelling have worsened.  The area began draining a small amount of fluid.  He denies any fever or chills headache or dizziness or visual changes although he does admit to increased swelling of his left upper eyelid which is making it difficult to open his eye.   Prior to Admission medications   Medication Sig Start Date End Date Taking? Authorizing Provider  ibuprofen  (ADVIL ) 800 MG tablet Take 1 tablet (800 mg total) by mouth every 8 (eight) hours as needed. 11/30/22   Margrette Taft BRAVO, MD  sulfamethoxazole -trimethoprim  (BACTRIM  DS) 800-160 MG tablet Take 1 tablet by mouth 2 (two) times daily. 12/15/23 12/22/23  [provider]    Allergies: Penicillins    Review of Systems  HENT:         Pain and swelling left eyebrow  All other systems reviewed and are negative.   Updated Vital Signs BP 128/87 (BP Location: Left Arm)   Pulse (!) 112   Temp 98 F (36.7 C) (Oral)   Resp 16   Ht 6' 4 (1.93 m)   Wt 83.9 kg   SpO2 99%   BMI 22.52 kg/m   Physical Exam Vitals and nursing note reviewed.  Constitutional:      Appearance: He is not toxic-appearing.  HENT:     Head:     Comments: 3 cm area of localized erythema with edema at the medial left eyebrow/glabella.  There is soft tissue swelling of the left upper  eyelid without erythema.  Some fluctuance is now noted that was not present on previous exam.  No active drainage    Mouth/Throat:     Mouth: Mucous membranes are moist.  Eyes:     Extraocular Movements: Extraocular movements intact.     Conjunctiva/sclera: Conjunctivae normal.     Pupils: Pupils are equal, round, and reactive to light.  Cardiovascular:     Rate and Rhythm: Normal rate and regular rhythm.  Pulmonary:     Effort: Pulmonary effort is normal.  Musculoskeletal:        General: Normal range of motion.     Cervical back: Normal range of motion.  Neurological:     General: No focal deficit present.     Mental Status: He is alert.     Motor: No weakness.     (all labs ordered are listed, but only abnormal results are displayed) Labs Reviewed - No data to display  EKG: None  Radiology: No results found.   Procedures    INCISION AND DRAINAGE Performed by: Denia Mcvicar Consent: Verbal consent obtained. Risks and benefits: risks, benefits and alternatives were discussed Type: abscess  Body area: left eyebrow  Anesthesia: local infiltration  Incision was made with a #11  scalpel.  Local anesthetic: lidocaine  2% w/o epinephrine   Anesthetic total: 2 ml  Complexity: complex Blunt dissection to break up loculations  Drainage: purulent  Drainage amount: small  Packing material: 1/4 in iodoform gauze  Patient tolerance: Patient tolerated the procedure well with no immediate complications.    Medications Ordered in the ED  povidone-iodine  (BETADINE ) 10 % external solution (1 Application Topical Given 12/17/23 1024)  lidocaine  HCl (PF) (XYLOCAINE ) 2 % injection 5 mL (has no administration in time range)  lidocaine  HCl (PF) (XYLOCAINE ) 2 % injection (has no administration in time range)  clindamycin  (CLEOCIN ) IVPB 600 mg (has no administration in time range)  lidocaine -EPINEPHrine -tetracaine  (LET) topical gel (3 mLs Topical Given 12/17/23 1025)                                     Medical Decision Making   Through shared decision making, patient is agreeable to I&D.  I suspect that this is an abscess.  There does not appear to be infectious spread involving the eye or eyelid.  His EOMs are intact well-appearing nontoxic  Amount and/or Complexity of Data Reviewed Discussion of management or test interpretation with external provider(s): Successful incision and drainage to abscess at the left eyebrow.  Reassuring labs 1 day prior to this visit.  Patient reports feeling better after procedure.  He is currently on Bactrim , will have him discontinue and prescription written for clindamycin .  He is agreeable to warm soaks and packing removal in 2 days.  Appears appropriate for discharge home  Risk OTC drugs. Prescription drug management.        Final diagnoses:  Facial abscess    ED Discharge Orders     None          Herlinda Milling, PA-C 12/19/23 1258    Freddi Hamilton, MD 12/21/23 [phone]

## 2023-12-17 NOTE — Discharge Instructions (Signed)
 Warm wet, compresses on and off to the area.  You may remove the packing and 2 days.  Stop the Bactrim  you were previously prescribed and start the clindamycin  later today.  Follow-up with your primary care provider for recheck return to ER for any new or worsening symptoms.
